# Patient Record
Sex: Female | Born: 1955 | Race: Black or African American | Hispanic: No | Smoking: Former smoker
Health system: Southern US, Community
[De-identification: ages and names within clinical notes are randomized; demographics above are authoritative.]

## PROBLEM LIST (undated history)

## (undated) DIAGNOSIS — R51 Headache: Secondary | ICD-10-CM

## (undated) DIAGNOSIS — I739 Peripheral vascular disease, unspecified: Secondary | ICD-10-CM

## (undated) DIAGNOSIS — K921 Melena: Secondary | ICD-10-CM

## (undated) DIAGNOSIS — Z720 Tobacco use: Secondary | ICD-10-CM

## (undated) DIAGNOSIS — I10 Essential (primary) hypertension: Secondary | ICD-10-CM

## (undated) DIAGNOSIS — E785 Hyperlipidemia, unspecified: Secondary | ICD-10-CM

## (undated) HISTORY — PX: ABDOMINAL HYSTERECTOMY: SHX81

## (undated) HISTORY — DX: Tobacco use: Z72.0

## (undated) HISTORY — DX: Peripheral vascular disease, unspecified: I73.9

---

## 1998-07-14 ENCOUNTER — Emergency Department (HOSPITAL_COMMUNITY): Admission: EM | Admit: 1998-07-14 | Discharge: 1998-07-14 | Payer: Self-pay | Admitting: Emergency Medicine

## 2000-07-23 ENCOUNTER — Emergency Department (HOSPITAL_COMMUNITY): Admission: EM | Admit: 2000-07-23 | Discharge: 2000-07-23 | Payer: Self-pay | Admitting: Emergency Medicine

## 2000-07-23 ENCOUNTER — Encounter: Payer: Self-pay | Admitting: Emergency Medicine

## 2005-03-15 ENCOUNTER — Inpatient Hospital Stay (HOSPITAL_COMMUNITY): Admission: EM | Admit: 2005-03-15 | Discharge: 2005-03-18 | Payer: Self-pay | Admitting: Emergency Medicine

## 2006-07-03 ENCOUNTER — Emergency Department (HOSPITAL_COMMUNITY): Admission: EM | Admit: 2006-07-03 | Discharge: 2006-07-03 | Payer: Self-pay | Admitting: Family Medicine

## 2006-08-02 ENCOUNTER — Emergency Department (HOSPITAL_COMMUNITY): Admission: EM | Admit: 2006-08-02 | Discharge: 2006-08-02 | Payer: Self-pay | Admitting: Emergency Medicine

## 2009-01-30 ENCOUNTER — Emergency Department (HOSPITAL_COMMUNITY): Admission: EM | Admit: 2009-01-30 | Discharge: 2009-01-31 | Payer: Self-pay | Admitting: Emergency Medicine

## 2009-05-02 ENCOUNTER — Emergency Department (HOSPITAL_COMMUNITY): Admission: EM | Admit: 2009-05-02 | Discharge: 2009-05-02 | Payer: Self-pay | Admitting: Family Medicine

## 2009-07-28 ENCOUNTER — Inpatient Hospital Stay (HOSPITAL_COMMUNITY): Admission: RE | Admit: 2009-07-28 | Discharge: 2009-07-30 | Payer: Self-pay | Admitting: Obstetrics and Gynecology

## 2009-07-28 ENCOUNTER — Encounter (INDEPENDENT_AMBULATORY_CARE_PROVIDER_SITE_OTHER): Payer: Self-pay | Admitting: Obstetrics and Gynecology

## 2010-06-08 LAB — CBC
HCT: 30.6 % — ABNORMAL LOW (ref 36.0–46.0)
Hemoglobin: 13.8 g/dL (ref 12.0–15.0)
MCHC: 33.3 g/dL (ref 30.0–36.0)
MCV: 82.3 fL (ref 78.0–100.0)
Platelets: 191 10*3/uL (ref 150–400)
RDW: 15.7 % — ABNORMAL HIGH (ref 11.5–15.5)
RDW: 16 % — ABNORMAL HIGH (ref 11.5–15.5)

## 2010-06-08 LAB — COMPREHENSIVE METABOLIC PANEL
ALT: 20 U/L (ref 0–35)
Albumin: 4 g/dL (ref 3.5–5.2)
Alkaline Phosphatase: 64 U/L (ref 39–117)
Glucose, Bld: 96 mg/dL (ref 70–99)
Potassium: 3.5 mEq/L (ref 3.5–5.1)
Sodium: 139 mEq/L (ref 135–145)
Total Protein: 7.8 g/dL (ref 6.0–8.3)

## 2010-06-08 LAB — PREGNANCY, URINE: Preg Test, Ur: NEGATIVE

## 2010-06-23 LAB — CBC
MCHC: 33.6 g/dL (ref 30.0–36.0)
MCV: 84.1 fL (ref 78.0–100.0)
Platelets: 240 10*3/uL (ref 150–400)

## 2010-06-23 LAB — DIFFERENTIAL
Eosinophils Relative: 1 % (ref 0–5)
Lymphocytes Relative: 43 % (ref 12–46)
Lymphs Abs: 2.8 10*3/uL (ref 0.7–4.0)
Monocytes Absolute: 0.5 10*3/uL (ref 0.1–1.0)
Neutro Abs: 3.2 10*3/uL (ref 1.7–7.7)

## 2010-06-23 LAB — LIPASE, BLOOD: Lipase: 22 U/L (ref 11–59)

## 2010-06-23 LAB — COMPREHENSIVE METABOLIC PANEL
AST: 18 U/L (ref 0–37)
Albumin: 3.8 g/dL (ref 3.5–5.2)
BUN: 11 mg/dL (ref 6–23)
Calcium: 9.2 mg/dL (ref 8.4–10.5)
Creatinine, Ser: 0.97 mg/dL (ref 0.4–1.2)
GFR calc Af Amer: >60 mL/min (ref >60)
GFR calc non Af Amer: >60 mL/min (ref >60)

## 2010-06-23 LAB — URINALYSIS, ROUTINE W REFLEX MICROSCOPIC
Bilirubin Urine: NEGATIVE
Ketones, ur: NEGATIVE mg/dL
Nitrite: NEGATIVE
Protein, ur: NEGATIVE mg/dL
Urobilinogen, UA: 0.2 mg/dL (ref 0.0–1.0)
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
## 2012-10-04 ENCOUNTER — Encounter (HOSPITAL_COMMUNITY): Payer: Self-pay | Admitting: *Deleted

## 2012-10-04 ENCOUNTER — Other Ambulatory Visit: Payer: Self-pay

## 2012-10-04 DIAGNOSIS — I10 Essential (primary) hypertension: Secondary | ICD-10-CM | POA: Insufficient documentation

## 2012-10-04 DIAGNOSIS — M940 Chondrocostal junction syndrome [Tietze]: Secondary | ICD-10-CM | POA: Insufficient documentation

## 2012-10-04 DIAGNOSIS — F17200 Nicotine dependence, unspecified, uncomplicated: Secondary | ICD-10-CM | POA: Insufficient documentation

## 2012-10-04 LAB — CBC WITH DIFFERENTIAL/PLATELET
Basophils Relative: 0 % (ref 0–1)
HCT: 38.7 % (ref 36.0–46.0)
Hemoglobin: 13.4 g/dL (ref 12.0–15.0)
Lymphocytes Relative: 53 % — ABNORMAL HIGH (ref 12–46)
Lymphs Abs: 3.6 10*3/uL (ref 0.7–4.0)
MCHC: 34.6 g/dL (ref 30.0–36.0)
Monocytes Absolute: 0.5 10*3/uL (ref 0.1–1.0)
Monocytes Relative: 7 % (ref 3–12)
Neutro Abs: 2.7 10*3/uL (ref 1.7–7.7)
Neutrophils Relative %: 39 % — ABNORMAL LOW (ref 43–77)
RBC: 4.96 MIL/uL (ref 3.87–5.11)

## 2012-10-04 LAB — POCT I-STAT TROPONIN I: Troponin i, poc: 0.03 ng/mL (ref 0.00–0.08)

## 2012-10-04 NOTE — ED Notes (Signed)
Pt states that she is having CP on the left side of her chest. Pt states that the pain increases when she sits low and has to stand up and when she also when she steps up, pt states when she coughs or turns.

## 2012-10-05 ENCOUNTER — Encounter (HOSPITAL_COMMUNITY): Payer: Self-pay | Admitting: *Deleted

## 2012-10-05 ENCOUNTER — Emergency Department (HOSPITAL_COMMUNITY): Payer: Self-pay

## 2012-10-05 ENCOUNTER — Emergency Department (HOSPITAL_COMMUNITY)
Admission: EM | Admit: 2012-10-05 | Discharge: 2012-10-05 | Disposition: A | Discharge status: Home / Self Care | Payer: Self-pay | Attending: Emergency Medicine | Admitting: Emergency Medicine

## 2012-10-05 DIAGNOSIS — M940 Chondrocostal junction syndrome [Tietze]: Secondary | ICD-10-CM

## 2012-10-05 HISTORY — DX: Essential (primary) hypertension: I10

## 2012-10-05 LAB — COMPREHENSIVE METABOLIC PANEL
Alkaline Phosphatase: 76 U/L (ref 39–117)
BUN: 9 mg/dL (ref 6–23)
CO2: 24 mEq/L (ref 19–32)
Chloride: 107 mEq/L (ref 96–112)
Creatinine, Ser: 1.08 mg/dL (ref 0.50–1.10)
GFR calc Af Amer: 65 mL/min — ABNORMAL LOW (ref >90)
GFR calc non Af Amer: 56 mL/min — ABNORMAL LOW (ref >90)
Glucose, Bld: 97 mg/dL (ref 70–99)
Potassium: 4 mEq/L (ref 3.5–5.1)
Total Bilirubin: 0.2 mg/dL — ABNORMAL LOW (ref 0.3–1.2)

## 2012-10-05 MED ORDER — IBUPROFEN 800 MG PO TABS
800.0000 mg | ORAL_TABLET | Freq: Once | ORAL | Status: AC
  Administered 2012-10-05: 800 mg via ORAL
  Filled 2012-10-05: qty 1

## 2012-10-05 MED ORDER — OXYCODONE-ACETAMINOPHEN 5-325 MG PO TABS: 1.0000 | ORAL_TABLET | ORAL | Status: DC | PRN

## 2012-10-05 NOTE — ED Provider Notes (Signed)
History    CSN: [id number] Arrival date & time 10/04/12  2323  First MD Initiated Contact with Patient 10/05/12 [phone]     Chief Complaint  Patient presents with  . Chest Pain   (Consider location/radiation/quality/duration/timing/severity/associated sxs/prior Treatment) The history is provided by the patient.   58 year old female has been having left anterior chest pain for the last week. Pain is sharp and is only present if she takes a deep breath or if she moves or twists or lays on her left side. She rates pain at 6/10 when present. There is no associated dyspnea, nausea, vomiting, diaphoresis. She's not had any fever or chills or sweats other than occasional hot flashes which she has been having for quite some time. She denies cough. She denies any trauma. She does smoke about one third pack of cigarettes a day. She has not taken any medication to try and help this. Past Medical History  Diagnosis Date  . Hypertension    Past Surgical History  Procedure Laterality Date  . Abdominal hysterectomy     History reviewed. No pertinent family history. History  Substance Use Topics  . Smoking status: Current Every Day Smoker  . Smokeless tobacco: Not on file  . Alcohol Use: Yes   OB History   Grav Para Term Preterm Abortions TAB SAB Ect Mult Living                 Review of Systems  All other systems reviewed and are negative.    Allergies  Review of patient's allergies indicates no known allergies.  Home Medications  No current outpatient prescriptions on file. BP 163/99  Pulse 64  Temp(Src) 98 F (36.7 C) (Oral)  SpO2 98% Physical Exam  Nursing note and vitals reviewed.  57 year old female, resting comfortably and in no acute distress. Vital signs are significant for hypertension with blood pressure 163/99. Oxygen saturation is 98%, which is normal. Head is normocephalic and atraumatic. PERRLA, EOMI. Oropharynx is clear. Neck is nontender and supple without  adenopathy or JVD. Back is nontender and there is no CVA tenderness. Lungs are clear without rales, wheezes, or rhonchi. Chest is moderately tender in the left anterior chest wall which reproduces her pain. Heart has regular rate and rhythm without murmur. Abdomen is soft, flat, nontender without masses or hepatosplenomegaly and peristalsis is normoactive. Extremities have no cyanosis or edema, full range of motion is present. Skin is warm and dry without rash. Neurologic: Mental status is normal, cranial nerves are intact, there are no motor or sensory deficits.  ED Course  Procedures (including critical care time) Results for orders placed during the hospital encounter of 10/05/12  CBC WITH DIFFERENTIAL      Result Value Range   WBC 6.9  4.0 - 10.5 K/uL   RBC 4.96  3.87 - 5.11 MIL/uL   Hemoglobin 13.4  12.0 - 15.0 g/dL   HCT 11.9  14.7 - 82.9 %   MCV 78.0  78.0 - 100.0 fL   MCH 27.0  26.0 - 34.0 pg   MCHC 34.6  30.0 - 36.0 g/dL   RDW 56.2  13.0 - 86.5 %   Platelets 261  150 - 400 K/uL   Neutrophils Relative % 39 (*) 43 - 77 %   Neutro Abs 2.7  1.7 - 7.7 K/uL   Lymphocytes Relative 53 (*) 12 - 46 %   Lymphs Abs 3.6  0.7 - 4.0 K/uL   Monocytes Relative 7  3 -  12 %   Monocytes Absolute 0.5  0.1 - 1.0 K/uL   Eosinophils Relative 2  0 - 5 %   Eosinophils Absolute 0.1  0.0 - 0.7 K/uL   Basophils Relative 0  0 - 1 %   Basophils Absolute 0.0  0.0 - 0.1 K/uL  COMPREHENSIVE METABOLIC PANEL      Result Value Range   Sodium 140  135 - 145 mEq/L   Potassium 4.0  3.5 - 5.1 mEq/L   Chloride 107  96 - 112 mEq/L   CO2 24  19 - 32 mEq/L   Glucose, Bld 97  70 - 99 mg/dL   BUN 9  6 - 23 mg/dL   Creatinine, Ser 1.61  0.50 - 1.10 mg/dL   Calcium 9.7  8.4 - 09.6 mg/dL   Total Protein 7.4  6.0 - 8.3 g/dL   Albumin 3.5  3.5 - 5.2 g/dL   AST 19  0 - 37 U/L   ALT 15  0 - 35 U/L   Alkaline Phosphatase 76  39 - 117 U/L   Total Bilirubin 0.2 (*) 0.3 - 1.2 mg/dL   GFR calc non Af Amer 56 (*) >90  mL/min   GFR calc Af Amer 65 (*) >90 mL/min  POCT I-STAT TROPONIN I      Result Value Range   Troponin i, poc 0.03  0.00 - 0.08 ng/mL   Comment 3            Dg Chest 2 View  10/05/2012   *RADIOLOGY REPORT*  Clinical Data: Left-sided chest pain on inspiration for 1 week.  CHEST - 2 VIEW  Comparison: Chest radiograph performed 03/16/2005  Findings: The lungs are well-aerated and clear.  There is no evidence of focal opacification, pleural effusion or pneumothorax.  The heart is normal in size; the mediastinal contour is within normal limits.  No acute osseous abnormalities are seen.  IMPRESSION: No acute cardiopulmonary process seen.   Original Report Authenticated By: Tonia Ghent, M.D.    Images viewed by me.  ECG shows normal sinus rhythm with sinus arrhythmia with a rate of 70, no ectopy. Normal axis. Normal P wave. Normal QRS. Normal intervals. Normal ST and T waves. Impression: normal ECG. No prior ECG available for comparison.   1. Costochondritis     MDM  Pleuritic chest pain most consistent with costochondritis. Right shift is noted on CBC which would be consistent with viral costochondritis. Chest x-ray will be obtained and that I anticipate sending her home with a prescription for naproxen. Blood pressure is noted to be elevated and she'll need to have that rechecked as an outpatient. She's advised to stop smoking.  Dione Booze, MD 10/05/12 [phone]

## 2013-04-10 ENCOUNTER — Ambulatory Visit (INDEPENDENT_AMBULATORY_CARE_PROVIDER_SITE_OTHER): Payer: Managed Care, Other (non HMO)

## 2013-04-10 VITALS — BP 149/90 | HR 77 | Resp 16 | Ht 66.0 in | Wt 155.0 lb

## 2013-04-10 DIAGNOSIS — M79609 Pain in unspecified limb: Secondary | ICD-10-CM

## 2013-04-10 DIAGNOSIS — G609 Hereditary and idiopathic neuropathy, unspecified: Secondary | ICD-10-CM

## 2013-04-10 DIAGNOSIS — I739 Peripheral vascular disease, unspecified: Secondary | ICD-10-CM

## 2013-04-10 DIAGNOSIS — G629 Polyneuropathy, unspecified: Secondary | ICD-10-CM

## 2013-04-10 DIAGNOSIS — Q828 Other specified congenital malformations of skin: Secondary | ICD-10-CM

## 2013-04-10 NOTE — Patient Instructions (Signed)
Peripheral Vascular Disease  Peripheral vascular disease (PVD) is caused by cholesterol buildup in the arteries. The arteries become narrow or clogged. This makes it hard for blood to flow. It happens most in the legs, but it can occur in other areas of your body. HOME CARE   Quit smoking, if you smoke.  Exercise as told by your doctor.  Follow a low-fat, low-cholesterol diet as told by your doctor.  Control your diabetes, if you have diabetes.  Care for your feet to prevent infection.  Only take medicine as told by your doctor. GET HELP RIGHT AWAY IF:   You have pain or lose feeling (numbness) in your arms or legs.  Your arms or legs turn cold or blue.  You have redness, warmth, and puffiness (swelling) in your arms or legs. MAKE SURE YOU:   Understand these instructions.  Will watch your condition.  Will get help right away if you are not doing well or get worse. Document Released: 06/01/2009 Document Revised: 05/30/2011 Document Reviewed: 06/01/2009 [facility] Patient Information 2014 [place], [place].   Keep the [facility] that [facility] for lower extremity arterial Doppler studies to confirm your circulation status. It is any extreme changes in severity her pain contact us immediately or go to the emergency room immediately. Especially there is any changes in coloration of the foot are spherical this in your foot or extremities.

## 2013-04-10 NOTE — Progress Notes (Signed)
   Subjective:    Patient ID: Glenetta HewBrenda S Lafrance, female    DOB: 01/02/1956, 58 y.o.   MRN: [id number]  HPI N numbness        L right forefoot        D 1 month        O at pm        C numbness, cold sensation, and sensation of being bunched together        A nighttime mostly and in bed        T dangle legs over bed, walking the bedroom Patient has numbness stinging and burning right foot more so than left has loss of sensation is weakness and balance the she does any prolonged walking or standing. She barely walk the length of her driveway without and take a break in sitdown with cramping of her upper right leg. Her feet greater right foot leg get cold sometimes bluish purple in the past although not reproducible at this visit.   Review of Systems  Constitutional: Positive for fatigue.       Up all night due to right foot  HENT: Negative.   Eyes: Negative.  Negative for discharge.  Respiratory: Negative.   Cardiovascular: Negative.   Gastrointestinal:       Right side abdominal pain,  Tightness at hip.  Blood in the stool occasionally.  Endocrine: Positive for cold intolerance and heat intolerance.  Genitourinary: Negative.   Musculoskeletal: Negative.   Skin: Negative.   Allergic/Immunologic: Negative.   Neurological: Negative.   Hematological: Negative.        Objective:   Physical Exam Vascular status as follows pedal pulses DP thready at plus one over 4 bilateral PT nonpalpable bilateral temperature warm to cool turgor diminished there is no edema number noted currently although may be history of rubra noted no pallor noted no varicosities noted. Skin color pigment normal hair growth absent. There is keratotic lesions sub-third and left heel in sub-14 on the right consistent with a verrucoid a poor keratotic lesions. Patient does have tingling burning and stinging right more so than left no history of injury trauma no history of back problems. Patient is now seen a primary  physician several years with previous he treated for hypertension and does have elevated blood pressure at today's visit Powers not to treat again for years. Refill timed 3-4 or 4-5 seconds all digits. Again symptoms of claudication with prolonged standing or walking. Orthopedic biomechanical exam relatively unremarkable some mild digital contractures semirigid in nature mild bunion deformity however otherwise good range of motion the ankle mid tarsus subtalar joint slight promontory changes noted clinically. No x-rays taken at this time. Random blood glucose taken at today's visit indicated 91 mg/dL reading which is a normal level however patient continues to have significant findings likely for vascular compromise and posse secondary neurologic symptoms we'll make arrangements for lower extremity arterial Doppler studies [facility] in the future also recommend a primary physician for further workup and evaluation.       Assessment & Plan:  Assessment this time cannot rule out claudication possible PAD symptomology. Patient may also have neuropathy possibly the diabetic type neuropathy. Has not seen a primary physician in prolonged years. Patient does have some atrophy the skin with multiple 4 keratoses will need palliative care in the future as needed we'll make arrangements for lower extremity arterial Doppler studies at this time and further vascular evaluation as needed thereafter.  Alvan Dameichard Kiara Keep DPM

## 2013-04-11 ENCOUNTER — Telehealth: Payer: Self-pay | Admitting: *Deleted

## 2013-04-11 ENCOUNTER — Other Ambulatory Visit: Payer: Self-pay | Admitting: *Deleted

## 2013-04-11 ENCOUNTER — Ambulatory Visit (HOSPITAL_COMMUNITY)
Admission: RE | Admit: 2013-04-11 | Discharge: 2013-04-11 | Disposition: A | Discharge status: Home / Self Care | Payer: Managed Care, Other (non HMO) | Source: Ambulatory Visit

## 2013-04-11 DIAGNOSIS — R52 Pain, unspecified: Secondary | ICD-10-CM

## 2013-04-11 DIAGNOSIS — I70209 Unspecified atherosclerosis of native arteries of extremities, unspecified extremity: Secondary | ICD-10-CM | POA: Insufficient documentation

## 2013-04-11 NOTE — Telephone Encounter (Signed)
Message copied by Marissa Nestle'CONNELL, VALERY D on Thu Apr 11, 2013 10:11 AM ------      Message from: KnightdaleSMITH, Shanda BumpsJESSICA M      Created: Wed Apr 10, 2013  3:49 PM      Regarding: Vascular Study      Contact: [phone]       Steward DroneBrenda called stating she called to get an appointment for the vascular study requested within the next week and the first available appointment is on February 4th. Steward DroneBrenda was wondering if this appointment date is okay. Requested for us to call her back.            Thanks.            Shanda BumpsJessica ------

## 2013-04-11 NOTE — Progress Notes (Signed)
Lower Extremity Arterial Duplex Completed. 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 Brianna L Mazza,RVT

## 2013-04-11 NOTE — Telephone Encounter (Signed)
Message copied by Marissa Nestle'CONNELL, Damel Querry D on Thu Apr 11, 2013  9:35 AM ------      Message from: [place]. JohnSMITH, Shanda BumpsJESSICA M      Created: Wed Apr 10, 2013  3:49 PM      Regarding: Vascular Study      Contact: [phone]       Steward DroneBrenda called stating she called to get an appointment for the vascular study requested within the next week and the first available appointment is on February 4th. Steward DroneBrenda was wondering if this appointment date is okay. Requested for us to call her back.            Thanks.            Shanda BumpsJessica ------

## 2013-04-11 NOTE — Telephone Encounter (Signed)
Pt states she called [facility], the earliest appt available was 04/24/2013.  I informed the pt that I made her referral to [facility] as STAT meaning as soon as possible, and she could call this am to schedule.

## 2013-04-23 ENCOUNTER — Other Ambulatory Visit: Payer: Self-pay | Admitting: Cardiovascular Disease

## 2013-04-23 ENCOUNTER — Ambulatory Visit (INDEPENDENT_AMBULATORY_CARE_PROVIDER_SITE_OTHER): Payer: Managed Care, Other (non HMO) | Admitting: Cardiovascular Disease

## 2013-04-23 ENCOUNTER — Encounter: Payer: Self-pay | Admitting: Cardiovascular Disease

## 2013-04-23 VITALS — BP 152/96 | HR 72 | Ht 66.0 in | Wt 165.0 lb

## 2013-04-23 DIAGNOSIS — R5381 Other malaise: Secondary | ICD-10-CM

## 2013-04-23 DIAGNOSIS — Z79899 Other long term (current) drug therapy: Secondary | ICD-10-CM

## 2013-04-23 DIAGNOSIS — E785 Hyperlipidemia, unspecified: Secondary | ICD-10-CM

## 2013-04-23 DIAGNOSIS — I739 Peripheral vascular disease, unspecified: Secondary | ICD-10-CM | POA: Insufficient documentation

## 2013-04-23 DIAGNOSIS — R5383 Other fatigue: Secondary | ICD-10-CM

## 2013-04-23 DIAGNOSIS — I10 Essential (primary) hypertension: Secondary | ICD-10-CM | POA: Insufficient documentation

## 2013-04-23 NOTE — Assessment & Plan Note (Signed)
Patient has had 2 years of claudication worse in the last several months. She was referred by [facility]ichard Sikora [facility] or for further evaluation. 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

## 2013-04-23 NOTE — Patient Instructions (Signed)
  We will see you back in follow up in after the tests.   Dr Allyson SabalBerry has ordered a lexiscan myoview, blood work, and a CT angio of pelvis and lower extremity with 1mm cuts from distal aorta down to the common femoral bilaterally.

## 2013-04-23 NOTE — Progress Notes (Signed)
04/23/2013 Kelly [name]   03-11-1956  [id number]  Primary Physician No PCP Per Patient Primary Cardiologist: Kelly Gess MD Kelly [name]   HPI:  Kelly [name] is a 58 year old moderately overweight separated African American female mother of 4 relative to grandchildren was accompanied by one of her daughters today. She is referred by Dr. Alvan [name] for peripheral vasodilation because of claudication. Her cardiovascular risk factor profile is positive for 20-pack-years of tobacco abuse smoking a pulse of one half pack per day. She has untreated hypertension. She does have a family history of heart disease with a father who died of a myocardial function at age 52 and several brothers who have had heart disease as well. She has never had a heart attack or stroke. She does complain of chest pain which occurs both with some of them sound anginal. She's had claudication for the last 2 years worse in the last 2 months. 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 She has resting leg pain and is unable to ambulate because of this.   No current outpatient prescriptions on file.   No current facility-administered medications for this visit.    No Known Allergies  History   Social History  . Marital Status: Legally Separated    Spouse Name: N/A    Number of Children: N/A  . Years of Education: N/A   Occupational History  . Not on file.   Social History Main Topics  . Smoking status: Current Every Day Smoker  . Smokeless tobacco: Not on file  . Alcohol Use: Yes  . Drug Use: No  . Sexual Activity: Not on file   Other Topics Concern  . Not on file   Social History Narrative  . No narrative on file     Review of Systems: General: negative for chills, fever, night sweats or weight changes.    Cardiovascular: negative for chest pain, dyspnea on exertion, edema, orthopnea, palpitations, paroxysmal nocturnal dyspnea or shortness of breath Dermatological: negative for rash Respiratory: negative for cough or wheezing Urologic: negative for hematuria Abdominal: negative for nausea, vomiting, diarrhea, bright red blood per rectum, melena, or hematemesis Neurologic: negative for visual changes, syncope, or dizziness All other systems reviewed and are otherwise negative except as noted above.    Blood pressure 152/96, pulse 72, height 5\' 6"  (1.676 m), weight 165 lb (74.844 kg).  General appearance: alert and no distress Neck: no adenopathy, no carotid bruit, no JVD, supple, symmetrical, trachea midline and thyroid not enlarged, symmetric, no tenderness/mass/nodules Lungs: clear to auscultation bilaterally Heart: regular rate and rhythm, S1, S2 normal, no murmur, click, rub or gallop Extremities: extremities normal, atraumatic, no cyanosis or edema and absent femoral and pedal pulses without bruits  EKG performed today  ASSESSMENT AND PLAN:   Claudication Patient has had 2 years of claudication worse in the last several months. She was referred by Dr. Alvan [name] at [facility] or for further evaluation. 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  Essential hypertension Blood pressure is mildly  elevated today. We'll check a basic metabolic panel to assess his renal function prior to beginning him on antihypertensive medications.      Kelly GessJonathan J. Maryon Kemnitz MD FACP,FACC,FAHA, [facility] 04/23/2013 1:33 PM

## 2013-04-23 NOTE — Assessment & Plan Note (Signed)
Blood pressure is mildly elevated today. We'll check a basic metabolic panel to assess his renal function prior to beginning him on antihypertensive medications.

## 2013-04-30 ENCOUNTER — Ambulatory Visit (HOSPITAL_COMMUNITY)
Admission: RE | Admit: 2013-04-30 | Discharge: 2013-04-30 | Disposition: A | Discharge status: Home / Self Care | Payer: Managed Care, Other (non HMO) | Source: Ambulatory Visit | Attending: Cardiovascular Disease | Admitting: Cardiovascular Disease

## 2013-04-30 DIAGNOSIS — Z8249 Family history of ischemic heart disease and other diseases of the circulatory system: Secondary | ICD-10-CM | POA: Insufficient documentation

## 2013-04-30 DIAGNOSIS — F17200 Nicotine dependence, unspecified, uncomplicated: Secondary | ICD-10-CM | POA: Insufficient documentation

## 2013-04-30 DIAGNOSIS — R0609 Other forms of dyspnea: Secondary | ICD-10-CM | POA: Insufficient documentation

## 2013-04-30 DIAGNOSIS — I739 Peripheral vascular disease, unspecified: Secondary | ICD-10-CM | POA: Insufficient documentation

## 2013-04-30 DIAGNOSIS — R5381 Other malaise: Secondary | ICD-10-CM | POA: Insufficient documentation

## 2013-04-30 DIAGNOSIS — R5383 Other fatigue: Secondary | ICD-10-CM

## 2013-04-30 DIAGNOSIS — R0989 Other specified symptoms and signs involving the circulatory and respiratory systems: Secondary | ICD-10-CM | POA: Insufficient documentation

## 2013-04-30 DIAGNOSIS — I10 Essential (primary) hypertension: Secondary | ICD-10-CM | POA: Insufficient documentation

## 2013-04-30 DIAGNOSIS — R079 Chest pain, unspecified: Secondary | ICD-10-CM | POA: Insufficient documentation

## 2013-04-30 MED ORDER — TECHNETIUM TC 99M SESTAMIBI GENERIC - CARDIOLITE
30.1000 | Freq: Once | INTRAVENOUS | Status: AC | PRN
  Administered 2013-04-30: 30.1 via INTRAVENOUS

## 2013-04-30 MED ORDER — AMINOPHYLLINE 25 MG/ML IV SOLN
75.0000 mg | Freq: Once | INTRAVENOUS | Status: AC
  Administered 2013-04-30: 75 mg via INTRAVENOUS

## 2013-04-30 MED ORDER — TECHNETIUM TC 99M SESTAMIBI GENERIC - CARDIOLITE
10.2000 | Freq: Once | INTRAVENOUS | Status: AC | PRN
  Administered 2013-04-30: 10 via INTRAVENOUS

## 2013-04-30 MED ORDER — REGADENOSON 0.4 MG/5ML IV SOLN
0.4000 mg | Freq: Once | INTRAVENOUS | Status: AC
  Administered 2013-04-30: 0.4 mg via INTRAVENOUS

## 2013-04-30 NOTE — Procedures (Addendum)
[facility] [address] [place] [address] [phone]  Cardiology Nuclear Med Study  Kelly [name] is a 58 y.o. female     MRN : [id number]     DOB: 16-Dec-1955  Procedure Date: 04/30/2013  Nuclear Med Background Indication for Stress Test:  Evaluation for Ischemia History:  No prior cardiac or respiratory history reported by patient. Cardiac Risk Factors: Family History - CAD, Hypertension, PVD and Smoker  Symptoms:  Chest Pain, DOE and Fatigue   Nuclear Pre-Procedure Caffeine/Decaff Intake:  12:00am NPO After: 10am   IV Site: R Forearm  IV 0.9% NS with Angio Cath:  22g  Chest Size (in):  n/a IV Started by: Emmit PomfretAmanda Hicks, RN  Height: 5\' 6"  (1.676 m)  Cup Size: C  BMI:  Body mass index is 26.64 kg/(m^2). Weight:  165 lb (74.844 kg)   Tech Comments:  n/a    Nuclear Med Study 1 or 2 day study: 1 day  Stress Test Type:  Lexiscan  Order Authorizing Provider:  Nanetta BattyJonathan Lamond Glantz, MD   Resting Radionuclide: Technetium 3413m Sestamibi  Resting Radionuclide Dose:10.2  mCi   Stress Radionuclide:  Technetium 6813m Sestamibi  Stress Radionuclide Dose: 30.1 mCi           Stress Protocol Rest HR: 62 Stress HR: 105  Rest BP: 154/97 Stress BP: 162/102  Exercise Time (min): n/a METS: n/a   Predicted Max HR: 163 bpm % Max HR: 64.42 bpm Rate Pressure Product: [id number]  Dose of Adenosine (mg):  n/a Dose of Lexiscan: 0.4 mg  Dose of Atropine (mg): n/a Dose of Dobutamine: n/a mcg/kg/min (at max HR)  Stress Test Technologist: Esperanza Sheetserry-Marie Martin, CCT Nuclear Technologist: Gonzella LexPam Phillips, CNMT   Rest Procedure:  Myocardial perfusion imaging was performed at rest 45 minutes following the intravenous administration of Technetium 6413m Sestamibi. Stress Procedure:  The patient received IV Lexiscan 0.4 mg over 15-seconds.  Technetium 4413m Sestamibi injected at 30-seconds.  There were no significant changes with Lexiscan.  Quantitative spect images  were obtained after a 45 minute delay. Pt complained of a headache post lexiscan infusion. Pt given 75 mg aminophylline iv with resolution of symptoms.  Transient Ischemic Dilatation (Normal <1.22):  0.85 Lung/Heart Ratio (Normal <0.45):  0.29 QGS EDV:  53 ml QGS ESV:  15 ml LV Ejection Fraction: 72%  Signed by      Rest ECG: NSR - Normal EKG  Stress ECG: No significant change from baseline ECG  QPS Raw Data Images:  Normal; no motion artifact; normal heart/lung ratio. Stress Images:  Normal homogeneous uptake in all areas of the myocardium. Rest Images:  Normal homogeneous uptake in all areas of the myocardium. Subtraction (SDS):  No evidence of ischemia.  Impression Exercise Capacity:  Lexiscan with no exercise. BP Response:  Normal blood pressure response. Clinical Symptoms:  No significant symptoms noted. ECG Impression:  No significant ST segment change suggestive of ischemia. Comparison with Prior Nuclear Study: No images to compare  Overall Impression:  Normal stress nuclear study.  LV Wall Motion:  NL LV Function; NL Wall Motion   [name],[name], MD  05/20/2013 5:53 PM

## 2013-05-03 ENCOUNTER — Ambulatory Visit
Admission: RE | Admit: 2013-05-03 | Discharge: 2013-05-03 | Disposition: A | Discharge status: Home / Self Care | Payer: Managed Care, Other (non HMO) | Source: Ambulatory Visit | Attending: Cardiovascular Disease | Admitting: Cardiovascular Disease

## 2013-05-03 DIAGNOSIS — R5383 Other fatigue: Secondary | ICD-10-CM

## 2013-05-03 DIAGNOSIS — I739 Peripheral vascular disease, unspecified: Secondary | ICD-10-CM

## 2013-05-03 MED ORDER — IOHEXOL 350 MG/ML SOLN
150.0000 mL | Freq: Once | INTRAVENOUS | Status: AC | PRN
  Administered 2013-05-03: 150 mL via INTRAVENOUS

## 2013-05-20 NOTE — Addendum Note (Signed)
Encounter addended by: Runell GessJonathan J Akayla Brass, MD on: 05/20/2013  6:28 PM<BR>     Documentation filed: Clinical Notes

## 2013-05-21 ENCOUNTER — Encounter: Payer: Self-pay | Admitting: *Deleted

## 2013-05-24 ENCOUNTER — Ambulatory Visit
Admission: RE | Admit: 2013-05-24 | Discharge: 2013-05-24 | Disposition: A | Discharge status: Home / Self Care | Payer: Managed Care, Other (non HMO) | Source: Ambulatory Visit | Attending: Cardiovascular Disease | Admitting: Cardiovascular Disease

## 2013-05-24 ENCOUNTER — Encounter: Payer: Self-pay | Admitting: Cardiovascular Disease

## 2013-05-24 ENCOUNTER — Encounter (HOSPITAL_COMMUNITY): Payer: Self-pay | Admitting: Pharmacy Technician

## 2013-05-24 ENCOUNTER — Ambulatory Visit (INDEPENDENT_AMBULATORY_CARE_PROVIDER_SITE_OTHER): Payer: Managed Care, Other (non HMO) | Admitting: Cardiovascular Disease

## 2013-05-24 VITALS — BP 160/100 | HR 76 | Ht 66.0 in | Wt 166.0 lb

## 2013-05-24 DIAGNOSIS — F17200 Nicotine dependence, unspecified, uncomplicated: Secondary | ICD-10-CM

## 2013-05-24 DIAGNOSIS — I10 Essential (primary) hypertension: Secondary | ICD-10-CM

## 2013-05-24 DIAGNOSIS — Z720 Tobacco use: Secondary | ICD-10-CM

## 2013-05-24 DIAGNOSIS — Z01818 Encounter for other preprocedural examination: Secondary | ICD-10-CM

## 2013-05-24 DIAGNOSIS — I739 Peripheral vascular disease, unspecified: Secondary | ICD-10-CM

## 2013-05-24 DIAGNOSIS — D689 Coagulation defect, unspecified: Secondary | ICD-10-CM

## 2013-05-24 LAB — CBC
HEMATOCRIT: 39.2 % (ref 36.0–46.0)
Hemoglobin: 13 g/dL (ref 12.0–15.0)
MCH: 26.6 pg (ref 26.0–34.0)
MCHC: 33.2 g/dL (ref 30.0–36.0)
MCV: 80.2 fL (ref 78.0–100.0)
Platelets: 323 10*3/uL (ref 150–400)
RBC: 4.89 MIL/uL (ref 3.87–5.11)
RDW: 16.8 % — AB (ref 11.5–15.5)
WBC: 6 10*3/uL (ref 4.0–10.5)

## 2013-05-24 NOTE — Assessment & Plan Note (Signed)
Her blood pressure is elevated today. I would check routine lab work on her and begin her on antihypertensive medication.

## 2013-05-24 NOTE — Assessment & Plan Note (Signed)
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 Prior to recommending best option for revascularization I think that abdominal aortography would be the next option. I'm going to arrange to perform his radially.

## 2013-05-24 NOTE — Progress Notes (Signed)
   05/24/2013 Kelly [name]   02/10/1956  [id number]  Primary Physician No PCP Per Patient Primary Cardiologist: Kelly [name] J. Kelly Milling MD FACP,FACC,FAHA, FSCAI    HPI:  Kelly [name] is a 57-year-old moderately overweight separated African American female mother of 4 relative to grandchildren was accompanied by one of her daughters today. She is referred by Dr. Richard [name] for peripheral vasodilation because of claudication. Her cardiovascular risk factor profile is positive for 20-pack-years of tobacco abuse smoking a pulse of one half pack per day. She has untreated hypertension. She does have a family history of heart disease with a father who died of a myocardial function at age 63 and several brothers who have had heart disease as well. She has never had a heart attack or stroke. She does complain of chest pain which occurs both with some of them sound anginal. She's had claudication for the last 2 years worse in the last 2 months. 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 She has resting leg pain and is unable to ambulate because of this. 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 A Myoview stress test was nonischemic. Based on this I plan on performing abdominal aortography via the right radial approach to further characterize her anatomy and determined that the best method for revascularization.   No current outpatient prescriptions on file.   No current facility-administered medications for this visit.    No Known Allergies  History   Social History  . Marital Status: Legally Separated    Spouse Name: N/A    Number of Children: N/A  . Years of Education: N/A   Occupational History  . Not on file.   Social History Main Topics  . Smoking status:  Current Every Day Smoker  . Smokeless tobacco: Not on file  . Alcohol Use: Yes  . Drug Use: No  . Sexual Activity: Not on file   Other Topics Concern  . Not on file   Social History Narrative  . No narrative on file     Review of Systems: General: negative for chills, fever, night sweats or weight changes.  Cardiovascular: negative for chest pain, dyspnea on exertion, edema, orthopnea, palpitations, paroxysmal nocturnal dyspnea or shortness of breath Dermatological: negative for rash Respiratory: negative for cough or wheezing Urologic: negative for hematuria Abdominal: negative for nausea, vomiting, diarrhea, bright red blood per rectum, melena, or hematemesis Neurologic: negative for visual changes, syncope, or dizziness All other systems reviewed and are otherwise negative except as noted above.    Blood pressure 160/100, pulse 76, height 5' 6" (1.676 m), weight 75.297 kg (166 lb).  General appearance: alert and no distress Neck: no adenopathy, no carotid bruit, no JVD, supple, symmetrical, trachea midline and thyroid not enlarged, symmetric, no tenderness/mass/nodules Lungs: clear to auscultation bilaterally Heart: regular rate and rhythm, S1, S2 normal, no murmur, click, rub or gallop Extremities: extremities normal, atraumatic, no cyanosis or edema and absent pedal pulses  EKG not performed today  ASSESSMENT AND PLAN:   Claudication The CT angiogram showed occlusion of the distal abdominal aorta just above the iliac bifurcation with reconstitution in the external iliac arteries. Prior to recommending best option for revascularization I think that abdominal aortography would be the next option. I'm going to arrange to perform his radially.    Essential hypertension Her blood pressure is elevated today. I would check routine lab work on her and begin her on antihypertensive medication.      Kelly [name] J. Jayin Derousse MD FACP,FACC,FAHA, FSCAI 05/24/2013 12:30 PM  

## 2013-05-24 NOTE — Patient Instructions (Signed)
Dr. Allyson SabalBerry has ordered a abdominal aortagram to be done RAD[facility].  This procedure is going to look at the bloodflow in your lower extremities.  If Dr. Allyson SabalBerry is able to open up the arteries, you will have to spend one night in the hospital.  If he is not able to open the arteries, you will be able to go home that same day.    After the procedure, you will not be allowed to drive for 3 days or push, pull, or lift anything greater than 10 lbs for one week.    You will be required to have bloodwork and a chest xray prior to your procedure.  Our scheduler will advise you on when these items need to be done.

## 2013-05-25 LAB — PROTIME-INR
INR: 1 (ref <1.50)
PROTHROMBIN TIME: 13.1 s (ref 11.6–15.2)

## 2013-05-25 LAB — BASIC METABOLIC PANEL
BUN: 9 mg/dL (ref 6–23)
CALCIUM: 10.4 mg/dL (ref 8.4–10.5)
CHLORIDE: 105 meq/L (ref 96–112)
CO2: 26 meq/L (ref 19–32)
Creat: 0.75 mg/dL (ref 0.50–1.10)
GLUCOSE: 89 mg/dL (ref 70–99)
Potassium: 4.2 mEq/L (ref 3.5–5.3)
SODIUM: 141 meq/L (ref 135–145)

## 2013-05-25 LAB — TSH: TSH: 0.492 u[IU]/mL (ref 0.350–4.500)

## 2013-05-25 LAB — APTT: aPTT: 27 seconds (ref 24–37)

## 2013-05-28 ENCOUNTER — Encounter: Payer: Self-pay | Admitting: *Deleted

## 2013-05-30 ENCOUNTER — Encounter (HOSPITAL_COMMUNITY)
Admission: RE | Disposition: A | Discharge status: Home / Self Care | Payer: Self-pay | Source: Ambulatory Visit | Attending: Cardiovascular Disease

## 2013-05-30 ENCOUNTER — Ambulatory Visit (HOSPITAL_COMMUNITY)
Admission: RE | Admit: 2013-05-30 | Discharge: 2013-05-30 | Disposition: A | Discharge status: Home / Self Care | Payer: Managed Care, Other (non HMO) | Source: Ambulatory Visit | Attending: Cardiovascular Disease | Admitting: Cardiovascular Disease

## 2013-05-30 DIAGNOSIS — I7409 Other arterial embolism and thrombosis of abdominal aorta: Secondary | ICD-10-CM | POA: Insufficient documentation

## 2013-05-30 DIAGNOSIS — I10 Essential (primary) hypertension: Secondary | ICD-10-CM | POA: Insufficient documentation

## 2013-05-30 DIAGNOSIS — Z01818 Encounter for other preprocedural examination: Secondary | ICD-10-CM

## 2013-05-30 DIAGNOSIS — E663 Overweight: Secondary | ICD-10-CM | POA: Insufficient documentation

## 2013-05-30 DIAGNOSIS — I739 Peripheral vascular disease, unspecified: Secondary | ICD-10-CM | POA: Insufficient documentation

## 2013-05-30 DIAGNOSIS — F17200 Nicotine dependence, unspecified, uncomplicated: Secondary | ICD-10-CM | POA: Insufficient documentation

## 2013-05-30 DIAGNOSIS — I745 Embolism and thrombosis of iliac artery: Secondary | ICD-10-CM | POA: Insufficient documentation

## 2013-05-30 DIAGNOSIS — Z8249 Family history of ischemic heart disease and other diseases of the circulatory system: Secondary | ICD-10-CM | POA: Insufficient documentation

## 2013-05-30 DIAGNOSIS — I700 Atherosclerosis of aorta: Secondary | ICD-10-CM

## 2013-05-30 HISTORY — PX: ABDOMINAL AORTAGRAM: SHX5454

## 2013-05-30 SURGERY — ABDOMINAL AORTAGRAM
Anesthesia: LOCAL

## 2013-05-30 MED ORDER — MORPHINE SULFATE 2 MG/ML IJ SOLN: 1.0000 mg | INTRAMUSCULAR | Status: DC | PRN

## 2013-05-30 MED ORDER — LIDOCAINE HCL (PF) 1 % IJ SOLN
INTRAMUSCULAR | Status: AC
  Filled 2013-05-30: qty 30

## 2013-05-30 MED ORDER — HEPARIN (PORCINE) IN NACL 2-0.9 UNIT/ML-% IJ SOLN
INTRAMUSCULAR | Status: AC
  Filled 2013-05-30: qty 1000

## 2013-05-30 MED ORDER — MIDAZOLAM HCL 2 MG/2ML IJ SOLN
INTRAMUSCULAR | Status: AC
  Filled 2013-05-30: qty 2

## 2013-05-30 MED ORDER — ASPIRIN 81 MG PO CHEW
81.0000 mg | CHEWABLE_TABLET | ORAL | Status: AC
  Administered 2013-05-30: 81 mg via ORAL

## 2013-05-30 MED ORDER — SODIUM CHLORIDE 0.9 % IJ SOLN: 3.0000 mL | INTRAMUSCULAR | Status: DC | PRN

## 2013-05-30 MED ORDER — ASPIRIN 81 MG PO CHEW
CHEWABLE_TABLET | ORAL | Status: AC
  Filled 2013-05-30: qty 1

## 2013-05-30 MED ORDER — HYDROCODONE-ACETAMINOPHEN 5-325 MG PO TABS
1.0000 | ORAL_TABLET | ORAL | Status: DC | PRN
  Administered 2013-05-30: 2 via ORAL
  Filled 2013-05-30: qty 2

## 2013-05-30 MED ORDER — VERAPAMIL HCL 2.5 MG/ML IV SOLN
INTRAVENOUS | Status: AC
  Filled 2013-05-30: qty 2

## 2013-05-30 MED ORDER — SODIUM CHLORIDE 0.9 % IV SOLN: INTRAVENOUS | Status: AC

## 2013-05-30 MED ORDER — DIAZEPAM 5 MG PO TABS
ORAL_TABLET | ORAL | Status: AC
  Administered 2013-05-30: 5 mg
  Filled 2013-05-30: qty 1

## 2013-05-30 MED ORDER — ASPIRIN EC 325 MG PO TBEC: 325.0000 mg | DELAYED_RELEASE_TABLET | Freq: Every day | ORAL | Status: DC

## 2013-05-30 MED ORDER — NITROGLYCERIN 0.2 MG/ML ON CALL CATH LAB
INTRAVENOUS | Status: AC
  Filled 2013-05-30: qty 1

## 2013-05-30 MED ORDER — DIAZEPAM 5 MG PO TABS
5.0000 mg | ORAL_TABLET | ORAL | Status: DC
Start: 2013-05-30 — End: 2013-05-30

## 2013-05-30 MED ORDER — FENTANYL CITRATE 0.05 MG/ML IJ SOLN
INTRAMUSCULAR | Status: AC
  Filled 2013-05-30: qty 2

## 2013-05-30 MED ORDER — SODIUM CHLORIDE 0.9 % IV SOLN
INTRAVENOUS | Status: DC
  Administered 2013-05-30: 07:00:00 via INTRAVENOUS

## 2013-05-30 NOTE — CV Procedure (Signed)
Kelly [name] is a 58 y.o. female    [id number] LOCATION:  FACILITY: [facility]  PHYSICIAN: Nanetta BattyJonathan Kenitha Glendinning, M.D. 1955-12-19   DATE OF PROCEDURE:  05/30/2013  DATE OF DISCHARGE:     PV Angiogram/Intervention    History obtained from chart review.Kelly [name] is a 58 year old moderately overweight separated [place] female mother of 4 relative to grandchildren was accompanied by one of her daughters today. She is referred by Dr. Alvan Dameichard Sikora for peripheral vasodilation because of claudication. Her cardiovascular risk factor profile is positive for 20-pack-years of tobacco abuse smoking a pulse of one half pack per day. She has untreated hypertension. She does have a family history of heart disease with a father who died of a myocardial function at age 58 and several brothers who have had heart disease as well. She has never had a heart attack or stroke. She does complain of chest pain which occurs both with some of them sound anginal. She's had claudication for the last 2 years worse in the last 2 months. 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 She has resting leg pain and is unable to ambulate because of this. 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 A Myoview stress test was nonischemic. Based on this I plan on performing abdominal aortography via the right radial approach to further characterize her anatomy and determined that the best method for revascularization.    PROCEDURE DESCRIPTION:   The patient was brought to the second floor [facility] cath lab in the postabsorptive state. She was premedicated with Valium 5 mg by mouth, IV Versed and fentanyl. Her right wristwas prepped and shaved in usual sterile fashion. Xylocaine 1% was used for local  anesthesia. A 5 French sheath was inserted into the right radial artery using standard Seldinger technique. The patient received 4000 units  of heparin  intravenously.  A 5 French 125 cm long straight pigtail catheter was advanced into the descending abdominal aorta and positioned just below the level of the renal arteries. Abdominal aortography and bilateral iliac angiography were then performed using bolus digital subtraction technique. Visipaque dye was she's to the entirety of the case (60 cc). A retrograde aortic pressure was monitored during the case.    HEMODYNAMICS:    AO SYSTOLIC/AO DIASTOLIC: 124/70   Angiographic Data:   1. Abdominal aortogram-abdominal aorta was occluded to below the level of the renal arteries. The left external iliac artery filled by inferior mesenteric and lumbar collaterals. The right common femoral artery filled by a similar collateral system. The renal arteries were widely patent.  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 She would best be treated with aortobifemoral bypass grafting. The sheath was removed and AP arm band was placed on the right wrist which he patent hemostasis. The patient left the lab in stable condition. She'll be discharged on 2 hours he'll be gently hydrated. I will see her back in the office in one to 2 weeks for followup and we'll refer her to Dr. Durene CalWells Brabham for surgical evaluation.    [name],Asami Lambright J. MD, [facility] 05/30/2013 9:51 AM

## 2013-05-30 NOTE — Progress Notes (Addendum)
CLIENT GETTING DRESSED AND CALLED OUT RIGHT RADIAL SITE BLEEDING; CAROL BOPE,RN FROM CATH LAB IN AND TR BAND PLACED AND CALLED BRITTNEY,PA FOR PAIN MED ORDER

## 2013-05-30 NOTE — Progress Notes (Signed)
HARRIOTT,RN NOTIFIED OF OOZING NOTED RIGHT RADIAL AND 2CC  REPLACED IN TR BAND

## 2013-05-30 NOTE — Interval H&P Note (Signed)
History and Physical Interval Note:  05/30/2013 9:02 AM  Kelly [name]  has presented today for surgery, with the diagnosis of claudication  The various methods of treatment have been discussed with the patient and family. After consideration of risks, benefits and other options for treatment, the patient has consented to  Procedure(s): ABDOMINAL AORTAGRAM (N/A) as a surgical intervention .  The patient's history has been reviewed, patient examined, no change in status, stable for surgery.  I have reviewed the patient's chart and labs.  Questions were answered to the patient's satisfaction.     [name],Rox Mcgriff J

## 2013-05-30 NOTE — Progress Notes (Signed)
BRITTNEY,PA IN TO SEE CLIENT AND OK TO D/C HOME PER BRITTNEY

## 2013-05-30 NOTE — H&P (View-Only) (Signed)
05/24/2013 Kelly [name]   03-10-1956  [id number]  Primary Physician No PCP Per Patient Primary Cardiologist: Runell Gess MD Roseanne Reno    HPI:  Kelly [name] is a 58 year old moderately overweight separated African American female mother of 4 relative to grandchildren was accompanied by one of her daughters today. She is referred by Dr. Alvan Dame for peripheral vasodilation because of claudication. Her cardiovascular risk factor profile is positive for 20-pack-years of tobacco abuse smoking a pulse of one half pack per day. She has untreated hypertension. She does have a family history of heart disease with a father who died of a myocardial function at age 43 and several brothers who have had heart disease as well. She has never had a heart attack or stroke. She does complain of chest pain which occurs both with some of them sound anginal. She's had claudication for the last 2 years worse in the last 2 months. 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 She has resting leg pain and is unable to ambulate because of this. 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 A Myoview stress test was nonischemic. Based on this I plan on performing abdominal aortography via the right radial approach to further characterize her anatomy and determined that the best method for revascularization.   No current outpatient prescriptions on file.   No current facility-administered medications for this visit.    No Known Allergies  History   Social History  . Marital Status: Legally Separated    Spouse Name: N/A    Number of Children: N/A  . Years of Education: N/A   Occupational History  . Not on file.   Social History Main Topics  . Smoking status:  Current Every Day Smoker  . Smokeless tobacco: Not on file  . Alcohol Use: Yes  . Drug Use: No  . Sexual Activity: Not on file   Other Topics Concern  . Not on file   Social History Narrative  . No narrative on file     Review of Systems: General: negative for chills, fever, night sweats or weight changes.  Cardiovascular: negative for chest pain, dyspnea on exertion, edema, orthopnea, palpitations, paroxysmal nocturnal dyspnea or shortness of breath Dermatological: negative for rash Respiratory: negative for cough or wheezing Urologic: negative for hematuria Abdominal: negative for nausea, vomiting, diarrhea, bright red blood per rectum, melena, or hematemesis Neurologic: negative for visual changes, syncope, or dizziness All other systems reviewed and are otherwise negative except as noted above.    Blood pressure 160/100, pulse 76, height 5\' 6"  (1.676 m), weight 75.297 kg (166 lb).  General appearance: alert and no distress Neck: no adenopathy, no carotid bruit, no JVD, supple, symmetrical, trachea midline and thyroid not enlarged, symmetric, no tenderness/mass/nodules Lungs: clear to auscultation bilaterally Heart: regular rate and rhythm, S1, S2 normal, no murmur, click, rub or gallop Extremities: extremities normal, atraumatic, no cyanosis or edema and absent pedal pulses  EKG not performed today  ASSESSMENT AND PLAN:   Claudication The CT angiogram showed occlusion of the distal abdominal aorta just above the iliac bifurcation with reconstitution in the external iliac arteries. Prior to recommending best option for revascularization I think that abdominal aortography would be the next option. I'm going to arrange to perform his radially.  Essential hypertension Her blood pressure is elevated today. I would check routine lab work on her and begin her on antihypertensive medication.      Runell GessJonathan J. Vinetta Brach MD FACP,FACC,FAHA, [facility] 05/24/2013 12:30 PM

## 2013-05-30 NOTE — Progress Notes (Signed)
PER BRITTNEY,PA OK TO D/C AT 1430

## 2013-05-30 NOTE — Progress Notes (Signed)
   Pt status right radial cardiac cath today. Pt had recurrent bleeding post removal of right radial TR band. The cath lab team was notified and a right radial TR band was reapplied at 1 pm. Instructions were given to deflate at 2 pm and to remove at 2:30 pm.  PO pain medicine was also ordered be given now for pain.   BRITTAINY SIMMONS, PA-C

## 2013-05-30 NOTE — Discharge Instructions (Signed)

## 2013-06-03 ENCOUNTER — Telehealth: Payer: Self-pay | Admitting: Surgery

## 2013-06-03 NOTE — Telephone Encounter (Signed)
notified patient of appt. with dr. Myra Gianottibrabham on 06-24-13 8:30 am as per staff message 06-01-13, mailed np information

## 2013-06-05 ENCOUNTER — Encounter: Payer: Self-pay | Admitting: Vascular Surgery

## 2013-06-06 ENCOUNTER — Encounter: Payer: Managed Care, Other (non HMO) | Admitting: Vascular Surgery

## 2013-06-06 ENCOUNTER — Telehealth: Payer: Self-pay | Admitting: Cardiovascular Disease

## 2013-06-06 NOTE — Telephone Encounter (Signed)
[facility] wanted to confirm that this patient needed to see Brabham. Yes, Dr Myra GianottiBrabham. please

## 2013-06-07 ENCOUNTER — Encounter: Payer: Self-pay | Admitting: Surgery

## 2013-06-10 ENCOUNTER — Encounter: Payer: Self-pay | Admitting: Surgery

## 2013-06-10 ENCOUNTER — Other Ambulatory Visit: Payer: Self-pay | Admitting: Surgery

## 2013-06-10 ENCOUNTER — Ambulatory Visit (INDEPENDENT_AMBULATORY_CARE_PROVIDER_SITE_OTHER): Payer: Managed Care, Other (non HMO) | Admitting: Surgery

## 2013-06-10 ENCOUNTER — Other Ambulatory Visit: Payer: Self-pay | Admitting: *Deleted

## 2013-06-10 ENCOUNTER — Ambulatory Visit (HOSPITAL_COMMUNITY)
Admission: RE | Admit: 2013-06-10 | Discharge: 2013-06-10 | Disposition: A | Discharge status: Home / Self Care | Payer: Managed Care, Other (non HMO) | Source: Ambulatory Visit | Attending: Surgery | Admitting: Surgery

## 2013-06-10 VITALS — BP 155/94 | HR 65 | Ht 66.0 in | Wt 166.0 lb

## 2013-06-10 DIAGNOSIS — Z01818 Encounter for other preprocedural examination: Secondary | ICD-10-CM | POA: Insufficient documentation

## 2013-06-10 DIAGNOSIS — Z01810 Encounter for preprocedural cardiovascular examination: Secondary | ICD-10-CM

## 2013-06-10 DIAGNOSIS — E7800 Pure hypercholesterolemia, unspecified: Secondary | ICD-10-CM

## 2013-06-10 DIAGNOSIS — I739 Peripheral vascular disease, unspecified: Secondary | ICD-10-CM

## 2013-06-10 MED ORDER — ATORVASTATIN CALCIUM 10 MG PO TABS: 10.0000 mg | ORAL_TABLET | Freq: Every day | ORAL | Status: DC

## 2013-06-10 NOTE — Progress Notes (Signed)
Patient name: Kelly [name] Para MRN: [id number] DOB: [birth year] Sex: female   Referred by: Dr. Allyson SabalBerry  Reason for referral:  Chief Complaint  Patient presents with  . New Evaluation    aorto-bi-fem BP     HISTORY OF PRESENT ILLNESS: This is a very pleasant 58 year old female that was referred for aortic occlusion.  The patient had a CT angiogram in February which shows an infrarenal aortic occlusion with reconstitution of the femoral vessels and no significant outflow a runoff disease.  She has also undergone angiography which confirms the above findings.  The patient states that she can not walk very far before her legs give out.  She also has rest pain in her right leg.  She continues to smoke.  She has been told she has hypertension, but for financial reasons has not been taking her antihypertensive medication.  Past Medical History  Diagnosis Date  . Hypertension   . Claudication   . Tobacco abuse     Past Surgical History  Procedure Laterality Date  . Abdominal hysterectomy      History   Social History  . Marital Status: Legally Separated    Spouse Name: N/A    Number of Children: N/A  . Years of Education: N/A   Occupational History  . Not on file.   Social History Main Topics  . Smoking status: Current Some Day Smoker  . Smokeless tobacco: Never Used     Comment: states she smokes 0-1 cig per day  . Alcohol Use: 0.6 oz/week    1 Cans of beer per week  . Drug Use: No  . Sexual Activity: Not on file   Other Topics Concern  . Not on file   Social History Narrative  . No narrative on file    Family History  Problem Relation Age of Onset  . Hypertension Mother   . Diabetes Mother   . Hyperlipidemia Mother   . Varicose Veins Mother   . Heart disease Father   . Hypertension Father   . Heart attack Father   . Hyperlipidemia Sister   . Hypertension Sister   . Diabetes Brother   . Hypertension Brother   . Heart attack Brother     Allergies as  of 06/10/2013  . (No Known Allergies)    Current Outpatient Prescriptions on File Prior to Visit  Medication Sig Dispense Refill  . aspirin EC 81 MG tablet Take 81 mg by mouth daily as needed for mild pain.      . Multiple Vitamin (MULTIVITAMIN WITH MINERALS) TABS tablet Take 1 tablet by mouth daily.       No current facility-administered medications on file prior to visit.     REVIEW OF SYSTEMS: Please see history of present illness, otherwise negative  PHYSICAL EXAMINATION: General: The patient appears their stated age.  Vital signs are BP 155/94  Pulse 65  Ht 5\' 6"  (1.676 m)  Wt 166 lb (75.297 kg)  BMI 26.81 kg/m2  SpO2 100% HEENT:  No gross abnormalities Pulmonary: Respirations are non-labored Abdomen: Soft and non-tender  Musculoskeletal: There are no major deformities.   Neurologic: No focal weakness or paresthesias are detected, Skin: There are no ulcer or rashes noted. Psychiatric: The patient has normal affect. Cardiovascular: There is a regular rate and rhythm without significant murmur appreciated.  No carotid bruits  Diagnostic Studies: I have reviewed both her CT angiogram and catheter-based angiography which shows aortic occlusion and reconstitution of the common femoral  vessels bilaterally   Medication Changes: I started her on Lipitor 10 mg daily  Assessment:  Aortic occlusion Plan: I discussed with the patient and her daughter the indications for surgery.  I feel that she is having rest pain in her right leg and therefore this needs to be done.  I do not see a minimally invasive approach.  I feel the patient needs an aortobifemoral bypass graft.  She has a large inferior mesenteric artery, so I will plan on reimplanting the inferior mesenteric artery.  We discussed all potential complications with surgery ranging from [employer] 2 intestinal ischemia, lower extremity ischemia, wound complications.  The patient is somewhat apprehensive but realizes that this needs  to be accomplished.  I am starting the patient on a statin today.  She was given a prescription for Lipitor 10 mg.  She needs to have a primary care physician.  I will try to get somebody involved in her care which he is in the hospital.  She will likely need to be reinitiated on blood pressure medication.  She has been cleared to proceed with surgery from cardiology.  This is been scheduled for Thursday, April 2.  She has never had a carotid ultrasound, and therefore I am scheduling for today.     Jorge Ny, M.D. [facility] Office: [phone] Pager:  [phone]

## 2013-06-10 NOTE — Addendum Note (Signed)
Addended by: Melodye PedMANESS-HARRISON, CHANDA C on: 06/10/2013 01:50 PM   Modules accepted: Orders

## 2013-06-11 ENCOUNTER — Other Ambulatory Visit: Payer: Self-pay

## 2013-06-14 ENCOUNTER — Encounter (HOSPITAL_COMMUNITY)
Admission: RE | Admit: 2013-06-14 | Discharge: 2013-06-14 | Disposition: A | Discharge status: Home / Self Care | Payer: Managed Care, Other (non HMO) | Source: Ambulatory Visit | Attending: Surgery | Admitting: Surgery

## 2013-06-14 ENCOUNTER — Encounter (HOSPITAL_COMMUNITY): Payer: Self-pay

## 2013-06-14 DIAGNOSIS — Z01812 Encounter for preprocedural laboratory examination: Secondary | ICD-10-CM | POA: Insufficient documentation

## 2013-06-14 HISTORY — DX: Headache: R51

## 2013-06-14 HISTORY — DX: Melena: K92.1

## 2013-06-14 HISTORY — DX: Hyperlipidemia, unspecified: E78.5

## 2013-06-14 LAB — URINALYSIS, ROUTINE W REFLEX MICROSCOPIC
Bilirubin Urine: NEGATIVE
GLUCOSE, UA: NEGATIVE mg/dL
Hgb urine dipstick: NEGATIVE
Ketones, ur: NEGATIVE mg/dL
LEUKOCYTES UA: NEGATIVE
Nitrite: NEGATIVE
PH: 6 (ref 5.0–8.0)
Protein, ur: NEGATIVE mg/dL
Specific Gravity, Urine: 1.008 (ref 1.005–1.030)
Urobilinogen, UA: 0.2 mg/dL (ref 0.0–1.0)

## 2013-06-14 LAB — PROTIME-INR
INR: 0.92 (ref 0.00–1.49)
Prothrombin Time: 12.2 seconds (ref 11.6–15.2)

## 2013-06-14 LAB — COMPREHENSIVE METABOLIC PANEL
ALT: 14 U/L (ref 0–35)
AST: 21 U/L (ref 0–37)
Albumin: 3.6 g/dL (ref 3.5–5.2)
Alkaline Phosphatase: 64 U/L (ref 39–117)
BUN: 8 mg/dL (ref 6–23)
CO2: 19 mEq/L (ref 19–32)
CREATININE: 0.62 mg/dL (ref 0.50–1.10)
Calcium: 9.6 mg/dL (ref 8.4–10.5)
Chloride: 106 mEq/L (ref 96–112)
GLUCOSE: 93 mg/dL (ref 70–99)
Potassium: 3.9 mEq/L (ref 3.7–5.3)
Sodium: 141 mEq/L (ref 137–147)
Total Bilirubin: 0.3 mg/dL (ref 0.3–1.2)
Total Protein: 7.6 g/dL (ref 6.0–8.3)

## 2013-06-14 LAB — BLOOD GAS, ARTERIAL
ACID-BASE DEFICIT: 1.2 mmol/L (ref 0.0–2.0)
BICARBONATE: 22.1 meq/L (ref 20.0–24.0)
Drawn by: 344381
FIO2: 0.21 %
O2 Saturation: 97 %
PH ART: 7.458 — AB (ref 7.350–7.450)
PO2 ART: 83.9 mmHg (ref 80.0–100.0)
Patient temperature: 98.6
TCO2: 23.1 mmol/L (ref 0–100)
pCO2 arterial: 31.7 mmHg — ABNORMAL LOW (ref 35.0–45.0)

## 2013-06-14 LAB — APTT: aPTT: 27 seconds (ref 24–37)

## 2013-06-14 LAB — CBC
HCT: 35.9 % — ABNORMAL LOW (ref 36.0–46.0)
HEMOGLOBIN: 12.5 g/dL (ref 12.0–15.0)
MCH: 27.4 pg (ref 26.0–34.0)
MCHC: 34.8 g/dL (ref 30.0–36.0)
MCV: 78.7 fL (ref 78.0–100.0)
Platelets: 284 10*3/uL (ref 150–400)
RBC: 4.56 MIL/uL (ref 3.87–5.11)
RDW: 15.4 % (ref 11.5–15.5)
WBC: 6.5 10*3/uL (ref 4.0–10.5)

## 2013-06-14 LAB — SURGICAL PCR SCREEN
MRSA, PCR: NEGATIVE
STAPHYLOCOCCUS AUREUS: NEGATIVE

## 2013-06-14 LAB — ABO/RH: ABO/RH(D): AB POS

## 2013-06-14 NOTE — Pre-Procedure Instructions (Signed)
Kelly [name]  06/14/2013   Your procedure is scheduled on:  Thursday, April 2nd   [facility] 3 at  5:30 AM.  Call this number if you have problems the morning of surgery: [phone]   Remember:   Do not eat food or drink liquids after midnight Wednesday.   Take these medicines the morning of surgery with A SIP OF WATER: none   Do not wear jewelry, make-up or nail polish.  Do not wear lotions, powders, or perfumes. You may NOT wear deodorant.  Do not shave 48 hours prior to surgery.    Do not bring valuables to the hospital.  [name] any belongings or valuables.               Contacts, dentures or bridgework may not be worn into surgery.  Leave suitcase in the car. After surgery it may be brought to your room.  For patients admitted to the hospital, discharge time is determined by your treatment team.             Name and phone number of your driver:    Special Instructions: "Preparing for Surgery" instruction sheet.    Please read over the following fact sheets that you were given: Pain Booklet, Blood Transfusion Information, MRSA Information and Surgical Site Infection Prevention

## 2013-06-14 NOTE — Progress Notes (Signed)
She states that she has seen blood in her stool occasionally...has had no further work up. Denies any cardiac issues.   Has not been sleeping hardly at all for last couple of months d/t the claudication in legs at night.  DA

## 2013-06-19 MED ORDER — CHLORHEXIDINE GLUCONATE 4 % EX LIQD
60.0000 mL | Freq: Once | CUTANEOUS | Status: DC
  Filled 2013-06-19: qty 60

## 2013-06-19 MED ORDER — DEXTROSE 5 % IV SOLN
1.5000 g | INTRAVENOUS | Status: AC
  Administered 2013-06-20 (×2): 1.5 g via INTRAVENOUS
  Filled 2013-06-19: qty 1.5

## 2013-06-19 MED ORDER — SODIUM CHLORIDE 0.9 % IV SOLN
INTRAVENOUS | Status: DC
  Administered 2013-06-20: 13:00:00 via INTRAVENOUS

## 2013-06-19 NOTE — Progress Notes (Signed)
Contacted pt with surgery time change. Instructed pt to be here tomorrow at 7:30 AM for a 9:30 surgery start time. Pt voiced understanding.

## 2013-06-20 ENCOUNTER — Encounter (HOSPITAL_COMMUNITY): Payer: Self-pay | Admitting: Anesthesiology

## 2013-06-20 ENCOUNTER — Inpatient Hospital Stay (HOSPITAL_COMMUNITY)
Admission: RE | Admit: 2013-06-20 | Discharge: 2013-06-26 | DRG: 238 | Disposition: A | Discharge status: Home / Self Care | Payer: Managed Care, Other (non HMO) | Source: Ambulatory Visit | Attending: Surgery | Admitting: Surgery

## 2013-06-20 ENCOUNTER — Inpatient Hospital Stay (HOSPITAL_COMMUNITY): Payer: Managed Care, Other (non HMO) | Admitting: Certified Registered"

## 2013-06-20 ENCOUNTER — Encounter (HOSPITAL_COMMUNITY): Payer: Managed Care, Other (non HMO) | Admitting: Certified Registered"

## 2013-06-20 ENCOUNTER — Inpatient Hospital Stay (HOSPITAL_COMMUNITY): Payer: Managed Care, Other (non HMO)

## 2013-06-20 ENCOUNTER — Encounter (HOSPITAL_COMMUNITY)
Admission: RE | Disposition: A | Discharge status: Home / Self Care | Payer: Managed Care, Other (non HMO) | Source: Ambulatory Visit | Attending: Surgery

## 2013-06-20 DIAGNOSIS — Z8249 Family history of ischemic heart disease and other diseases of the circulatory system: Secondary | ICD-10-CM

## 2013-06-20 DIAGNOSIS — I70219 Atherosclerosis of native arteries of extremities with intermittent claudication, unspecified extremity: Secondary | ICD-10-CM

## 2013-06-20 DIAGNOSIS — E785 Hyperlipidemia, unspecified: Secondary | ICD-10-CM | POA: Diagnosis present

## 2013-06-20 DIAGNOSIS — Z7982 Long term (current) use of aspirin: Secondary | ICD-10-CM

## 2013-06-20 DIAGNOSIS — I739 Peripheral vascular disease, unspecified: Secondary | ICD-10-CM | POA: Diagnosis present

## 2013-06-20 DIAGNOSIS — I359 Nonrheumatic aortic valve disorder, unspecified: Principal | ICD-10-CM | POA: Diagnosis present

## 2013-06-20 DIAGNOSIS — I350 Nonrheumatic aortic (valve) stenosis: Secondary | ICD-10-CM | POA: Diagnosis present

## 2013-06-20 DIAGNOSIS — D62 Acute posthemorrhagic anemia: Secondary | ICD-10-CM | POA: Diagnosis not present

## 2013-06-20 DIAGNOSIS — I7409 Other arterial embolism and thrombosis of abdominal aorta: Secondary | ICD-10-CM

## 2013-06-20 DIAGNOSIS — I10 Essential (primary) hypertension: Secondary | ICD-10-CM | POA: Diagnosis present

## 2013-06-20 DIAGNOSIS — F17200 Nicotine dependence, unspecified, uncomplicated: Secondary | ICD-10-CM | POA: Diagnosis present

## 2013-06-20 HISTORY — PX: AORTA - BILATERAL FEMORAL ARTERY BYPASS GRAFT: SHX1175

## 2013-06-20 LAB — BLOOD GAS, ARTERIAL
ACID-BASE DEFICIT: 2.6 mmol/L — AB (ref 0.0–2.0)
BICARBONATE: 22 meq/L (ref 20.0–24.0)
O2 Content: 4 L/min
O2 Saturation: 98.8 %
PATIENT TEMPERATURE: 97.7
PH ART: 7.362 (ref 7.350–7.450)
TCO2: 23.3 mmol/L (ref 0–100)
pCO2 arterial: 39.5 mmHg (ref 35.0–45.0)
pO2, Arterial: 127 mmHg — ABNORMAL HIGH (ref 80.0–100.0)

## 2013-06-20 LAB — POCT I-STAT 7, (LYTES, BLD GAS, ICA,H+H)
ACID-BASE DEFICIT: 3 mmol/L — AB (ref 0.0–2.0)
Acid-base deficit: 2 mmol/L (ref 0.0–2.0)
Bicarbonate: 22.9 mEq/L (ref 20.0–24.0)
Bicarbonate: 22.7 mEq/L (ref 20.0–24.0)
CALCIUM ION: 1.18 mmol/L (ref 1.12–1.23)
Calcium, Ion: 1.17 mmol/L (ref 1.12–1.23)
HCT: 30 % — ABNORMAL LOW (ref 36.0–46.0)
HCT: 31 % — ABNORMAL LOW (ref 36.0–46.0)
HEMOGLOBIN: 10.2 g/dL — AB (ref 12.0–15.0)
Hemoglobin: 10.5 g/dL — ABNORMAL LOW (ref 12.0–15.0)
O2 Saturation: 99 %
O2 Saturation: 100 %
PCO2 ART: 36.2 mmHg (ref 35.0–45.0)
PH ART: 7.408 (ref 7.350–7.450)
PH ART: 7.36 (ref 7.350–7.450)
PO2 ART: 149 mmHg — AB (ref 80.0–100.0)
PO2 ART: 203 mmHg — AB (ref 80.0–100.0)
POTASSIUM: 3.7 meq/L (ref 3.7–5.3)
Potassium: 3.3 mEq/L — ABNORMAL LOW (ref 3.7–5.3)
Sodium: 140 mEq/L (ref 137–147)
Sodium: 140 mEq/L (ref 137–147)
TCO2: 24 mmol/L (ref 0–100)
TCO2: 24 mmol/L (ref 0–100)
pCO2 arterial: 40.2 mmHg (ref 35.0–45.0)

## 2013-06-20 LAB — BASIC METABOLIC PANEL
BUN: 8 mg/dL (ref 6–23)
CALCIUM: 8.3 mg/dL — AB (ref 8.4–10.5)
CO2: 21 mEq/L (ref 19–32)
Chloride: 105 mEq/L (ref 96–112)
Creatinine, Ser: 0.62 mg/dL (ref 0.50–1.10)
Glucose, Bld: 149 mg/dL — ABNORMAL HIGH (ref 70–99)
POTASSIUM: 3.9 meq/L (ref 3.7–5.3)
SODIUM: 140 meq/L (ref 137–147)

## 2013-06-20 LAB — PROTIME-INR
INR: 1.24 (ref 0.00–1.49)
Prothrombin Time: 15.3 seconds — ABNORMAL HIGH (ref 11.6–15.2)

## 2013-06-20 LAB — CBC
HCT: 30.3 % — ABNORMAL LOW (ref 36.0–46.0)
Hemoglobin: 10.3 g/dL — ABNORMAL LOW (ref 12.0–15.0)
MCH: 27.1 pg (ref 26.0–34.0)
MCHC: 34 g/dL (ref 30.0–36.0)
MCV: 79.7 fL (ref 78.0–100.0)
Platelets: 197 10*3/uL (ref 150–400)
RBC: 3.8 MIL/uL — ABNORMAL LOW (ref 3.87–5.11)
RDW: 15.3 % (ref 11.5–15.5)
WBC: 10.6 10*3/uL — ABNORMAL HIGH (ref 4.0–10.5)

## 2013-06-20 LAB — APTT: APTT: 27 s (ref 24–37)

## 2013-06-20 LAB — MAGNESIUM: Magnesium: 1.6 mg/dL (ref 1.5–2.5)

## 2013-06-20 SURGERY — CREATION, BYPASS, ARTERIAL, AORTA TO FEMORAL, BILATERAL, USING GRAFT
Anesthesia: General | Site: Abdomen

## 2013-06-20 MED ORDER — SODIUM CHLORIDE 0.9 % IR SOLN
Status: DC | PRN
  Administered 2013-06-20: 10:00:00

## 2013-06-20 MED ORDER — NITROGLYCERIN 0.2 MG/ML ON CALL CATH LAB
INTRAVENOUS | Status: DC | PRN
  Administered 2013-06-20 (×3): 20 ug via INTRAVENOUS

## 2013-06-20 MED ORDER — NEOSTIGMINE METHYLSULFATE 1 MG/ML IJ SOLN
INTRAMUSCULAR | Status: AC
Start: 2013-06-20 — End: 2013-06-20
  Filled 2013-06-20: qty 10

## 2013-06-20 MED ORDER — PHENYLEPHRINE HCL 10 MG/ML IJ SOLN
10.0000 mg | INTRAMUSCULAR | Status: DC | PRN
  Administered 2013-06-20: 15 ug/min via INTRAVENOUS

## 2013-06-20 MED ORDER — LACTATED RINGERS IV SOLN
INTRAVENOUS | Status: DC | PRN
  Administered 2013-06-20 (×2): via INTRAVENOUS

## 2013-06-20 MED ORDER — HEPARIN SODIUM (PORCINE) 1000 UNIT/ML IJ SOLN
INTRAMUSCULAR | Status: DC | PRN
  Administered 2013-06-20: 2000 [IU] via INTRAVENOUS
  Administered 2013-06-20: 7000 [IU] via INTRAVENOUS
  Administered 2013-06-20: 1000 [IU] via INTRAVENOUS

## 2013-06-20 MED ORDER — DOPAMINE-DEXTROSE 3.2-5 MG/ML-% IV SOLN: 3.0000 ug/kg/min | INTRAVENOUS | Status: DC

## 2013-06-20 MED ORDER — GLYCOPYRROLATE 0.2 MG/ML IJ SOLN
INTRAMUSCULAR | Status: DC | PRN
  Administered 2013-06-20: 0.6 mg via INTRAVENOUS

## 2013-06-20 MED ORDER — PROTAMINE SULFATE 10 MG/ML IV SOLN
INTRAVENOUS | Status: AC
  Filled 2013-06-20: qty 5

## 2013-06-20 MED ORDER — LACTATED RINGERS IV SOLN
INTRAVENOUS | Status: DC | PRN
  Administered 2013-06-20 (×2): via INTRAVENOUS

## 2013-06-20 MED ORDER — ARTIFICIAL TEARS OP OINT
TOPICAL_OINTMENT | OPHTHALMIC | Status: AC
  Filled 2013-06-20: qty 3.5

## 2013-06-20 MED ORDER — ONDANSETRON HCL 4 MG/2ML IJ SOLN
INTRAMUSCULAR | Status: DC | PRN
  Administered 2013-06-20: 4 mg via INTRAVENOUS

## 2013-06-20 MED ORDER — HYDROMORPHONE HCL PF 1 MG/ML IJ SOLN
0.2500 mg | INTRAMUSCULAR | Status: DC | PRN
  Administered 2013-06-20 (×4): 0.5 mg via INTRAVENOUS

## 2013-06-20 MED ORDER — ONDANSETRON HCL 4 MG/2ML IJ SOLN: 4.0000 mg | Freq: Four times a day (QID) | INTRAMUSCULAR | Status: DC | PRN

## 2013-06-20 MED ORDER — HEPARIN SODIUM (PORCINE) 1000 UNIT/ML IJ SOLN
INTRAMUSCULAR | Status: AC
  Filled 2013-06-20: qty 1

## 2013-06-20 MED ORDER — EPHEDRINE SULFATE 50 MG/ML IJ SOLN
INTRAMUSCULAR | Status: AC
  Filled 2013-06-20: qty 1

## 2013-06-20 MED ORDER — ACETAMINOPHEN 650 MG RE SUPP: 325.0000 mg | RECTAL | Status: DC | PRN

## 2013-06-20 MED ORDER — VECURONIUM BROMIDE 10 MG IV SOLR
INTRAVENOUS | Status: DC | PRN
  Administered 2013-06-20 (×2): 2 mg via INTRAVENOUS
  Administered 2013-06-20: 1 mg via INTRAVENOUS
  Administered 2013-06-20 (×2): 2 mg via INTRAVENOUS
  Administered 2013-06-20 (×2): 3 mg via INTRAVENOUS
  Administered 2013-06-20: 1 mg via INTRAVENOUS
  Administered 2013-06-20: 2 mg via INTRAVENOUS

## 2013-06-20 MED ORDER — LIDOCAINE HCL (CARDIAC) 20 MG/ML IV SOLN
INTRAVENOUS | Status: DC | PRN
  Administered 2013-06-20: 60 mg via INTRAVENOUS

## 2013-06-20 MED ORDER — ACETAMINOPHEN 325 MG PO TABS: 325.0000 mg | ORAL_TABLET | ORAL | Status: DC | PRN

## 2013-06-20 MED ORDER — VECURONIUM BROMIDE 10 MG IV SOLR
INTRAVENOUS | Status: AC
  Filled 2013-06-20: qty 10

## 2013-06-20 MED ORDER — DOCUSATE SODIUM 100 MG PO CAPS: 100.0000 mg | ORAL_CAPSULE | Freq: Every day | ORAL | Status: DC

## 2013-06-20 MED ORDER — PANTOPRAZOLE SODIUM 40 MG PO TBEC: 40.0000 mg | DELAYED_RELEASE_TABLET | Freq: Every day | ORAL | Status: DC

## 2013-06-20 MED ORDER — GUAIFENESIN-DM 100-10 MG/5ML PO SYRP: 15.0000 mL | ORAL_SOLUTION | ORAL | Status: DC | PRN

## 2013-06-20 MED ORDER — ZOLPIDEM TARTRATE 5 MG PO TABS: 5.0000 mg | ORAL_TABLET | Freq: Once | ORAL | Status: DC

## 2013-06-20 MED ORDER — PHENOL 1.4 % MT LIQD: 1.0000 | OROMUCOSAL | Status: DC | PRN

## 2013-06-20 MED ORDER — HYDRALAZINE HCL 20 MG/ML IJ SOLN
10.0000 mg | INTRAMUSCULAR | Status: DC | PRN
Start: 2013-06-20 — End: 2013-06-26
  Administered 2013-06-20: 10 mg via INTRAVENOUS
  Filled 2013-06-20: qty 1

## 2013-06-20 MED ORDER — GLYCOPYRROLATE 0.2 MG/ML IJ SOLN
INTRAMUSCULAR | Status: AC
  Filled 2013-06-20: qty 3

## 2013-06-20 MED ORDER — LABETALOL HCL 5 MG/ML IV SOLN
10.0000 mg | INTRAVENOUS | Status: DC | PRN
  Filled 2013-06-20: qty 4

## 2013-06-20 MED ORDER — METOPROLOL TARTRATE 1 MG/ML IV SOLN: 2.0000 mg | INTRAVENOUS | Status: DC | PRN

## 2013-06-20 MED ORDER — STERILE WATER FOR INJECTION IJ SOLN
INTRAMUSCULAR | Status: AC
  Filled 2013-06-20: qty 40

## 2013-06-20 MED ORDER — ROCURONIUM BROMIDE 50 MG/5ML IV SOLN
INTRAVENOUS | Status: AC
  Filled 2013-06-20: qty 1

## 2013-06-20 MED ORDER — PROPOFOL 10 MG/ML IV BOLUS
INTRAVENOUS | Status: DC | PRN
  Administered 2013-06-20: 20 mg via INTRAVENOUS
  Administered 2013-06-20: 30 mg via INTRAVENOUS
  Administered 2013-06-20: 120 mg via INTRAVENOUS

## 2013-06-20 MED ORDER — FENTANYL CITRATE 0.05 MG/ML IJ SOLN
INTRAMUSCULAR | Status: AC
  Filled 2013-06-20: qty 5

## 2013-06-20 MED ORDER — DEXTROSE 5 % IV SOLN
1.5000 g | Freq: Once | INTRAVENOUS | Status: DC
  Filled 2013-06-20: qty 1.5

## 2013-06-20 MED ORDER — DIPHENHYDRAMINE HCL 12.5 MG/5ML PO ELIX
12.5000 mg | ORAL_SOLUTION | Freq: Four times a day (QID) | ORAL | Status: DC | PRN
Start: 2013-06-20 — End: 2013-06-21
  Filled 2013-06-20: qty 5

## 2013-06-20 MED ORDER — DOPAMINE-DEXTROSE 3.2-5 MG/ML-% IV SOLN
3.0000 ug/kg/min | INTRAVENOUS | Status: DC
Start: 2013-06-20 — End: 2013-06-20

## 2013-06-20 MED ORDER — LIDOCAINE HCL (CARDIAC) 20 MG/ML IV SOLN
INTRAVENOUS | Status: AC
  Filled 2013-06-20: qty 5

## 2013-06-20 MED ORDER — SODIUM CHLORIDE 0.9 % IJ SOLN: 9.0000 mL | INTRAMUSCULAR | Status: DC | PRN

## 2013-06-20 MED ORDER — MAGNESIUM SULFATE 40 MG/ML IJ SOLN
2.0000 g | Freq: Every day | INTRAMUSCULAR | Status: AC | PRN
  Administered 2013-06-20: 2 g via INTRAVENOUS
  Filled 2013-06-20: qty 50

## 2013-06-20 MED ORDER — PHENYLEPHRINE HCL 10 MG/ML IJ SOLN
INTRAMUSCULAR | Status: DC | PRN
  Administered 2013-06-20 (×2): 80 ug via INTRAVENOUS

## 2013-06-20 MED ORDER — ALUM & MAG HYDROXIDE-SIMETH 200-200-20 MG/5ML PO SUSP: 15.0000 mL | ORAL | Status: DC | PRN

## 2013-06-20 MED ORDER — ROCURONIUM BROMIDE 100 MG/10ML IV SOLN
INTRAVENOUS | Status: DC | PRN
  Administered 2013-06-20: 50 mg via INTRAVENOUS

## 2013-06-20 MED ORDER — NEOSTIGMINE METHYLSULFATE 1 MG/ML IJ SOLN
INTRAMUSCULAR | Status: DC | PRN
  Administered 2013-06-20: 4 mg via INTRAVENOUS

## 2013-06-20 MED ORDER — FENTANYL CITRATE 0.05 MG/ML IJ SOLN
INTRAMUSCULAR | Status: DC | PRN
  Administered 2013-06-20 (×2): 100 ug via INTRAVENOUS
  Administered 2013-06-20: 50 ug via INTRAVENOUS
  Administered 2013-06-20: 100 ug via INTRAVENOUS
  Administered 2013-06-20: 50 ug via INTRAVENOUS
  Administered 2013-06-20: 100 ug via INTRAVENOUS
  Administered 2013-06-20 (×3): 50 ug via INTRAVENOUS
  Administered 2013-06-20: 150 ug via INTRAVENOUS
  Administered 2013-06-20: 50 ug via INTRAVENOUS
  Administered 2013-06-20: 100 ug via INTRAVENOUS
  Administered 2013-06-20: 50 ug via INTRAVENOUS

## 2013-06-20 MED ORDER — MORPHINE SULFATE (PF) 1 MG/ML IV SOLN
INTRAVENOUS | Status: DC
  Administered 2013-06-20: 21:00:00 via INTRAVENOUS
  Administered 2013-06-21: 4 mg via INTRAVENOUS
  Administered 2013-06-21: 06:00:00 via INTRAVENOUS
  Administered 2013-06-21: 11 mg via INTRAVENOUS
  Administered 2013-06-21: 12 mg via INTRAVENOUS
  Filled 2013-06-20 (×2): qty 25

## 2013-06-20 MED ORDER — 0.9 % SODIUM CHLORIDE (POUR BTL) OPTIME
TOPICAL | Status: DC | PRN
  Administered 2013-06-20: 3000 mL

## 2013-06-20 MED ORDER — OXYCODONE HCL 5 MG PO TABS: 5.0000 mg | ORAL_TABLET | Freq: Once | ORAL | Status: DC | PRN

## 2013-06-20 MED ORDER — POTASSIUM CHLORIDE CRYS ER 20 MEQ PO TBCR
20.0000 meq | EXTENDED_RELEASE_TABLET | Freq: Every day | ORAL | Status: DC | PRN
  Filled 2013-06-20: qty 2

## 2013-06-20 MED ORDER — NALOXONE HCL 0.4 MG/ML IJ SOLN: 0.4000 mg | INTRAMUSCULAR | Status: DC | PRN

## 2013-06-20 MED ORDER — DEXTROSE 5 % IV SOLN
1.5000 g | Freq: Two times a day (BID) | INTRAVENOUS | Status: AC
  Administered 2013-06-20 – 2013-06-21 (×2): 1.5 g via INTRAVENOUS
  Filled 2013-06-20 (×2): qty 1.5

## 2013-06-20 MED ORDER — SODIUM CHLORIDE 0.9 % IV SOLN: 500.0000 mL | Freq: Once | INTRAVENOUS | Status: AC | PRN

## 2013-06-20 MED ORDER — ALBUMIN HUMAN 5 % IV SOLN
INTRAVENOUS | Status: DC | PRN
  Administered 2013-06-20 (×3): via INTRAVENOUS

## 2013-06-20 MED ORDER — BISACODYL 10 MG RE SUPP: 10.0000 mg | Freq: Every day | RECTAL | Status: DC | PRN

## 2013-06-20 MED ORDER — MIDAZOLAM HCL 5 MG/5ML IJ SOLN
INTRAMUSCULAR | Status: DC | PRN
  Administered 2013-06-20: 2 mg via INTRAVENOUS

## 2013-06-20 MED ORDER — PROPOFOL 10 MG/ML IV BOLUS
INTRAVENOUS | Status: AC
  Filled 2013-06-20: qty 20

## 2013-06-20 MED ORDER — PROTAMINE SULFATE 10 MG/ML IV SOLN
INTRAVENOUS | Status: DC | PRN
  Administered 2013-06-20: 40 mg via INTRAVENOUS
  Administered 2013-06-20: 10 mg via INTRAVENOUS

## 2013-06-20 MED ORDER — LACTATED RINGERS IV SOLN
INTRAVENOUS | Status: DC
  Administered 2013-06-20: 08:00:00 via INTRAVENOUS

## 2013-06-20 MED ORDER — OXYCODONE HCL 5 MG/5ML PO SOLN: 5.0000 mg | Freq: Once | ORAL | Status: DC | PRN

## 2013-06-20 MED ORDER — ONDANSETRON HCL 4 MG/2ML IJ SOLN
INTRAMUSCULAR | Status: AC
  Filled 2013-06-20: qty 2

## 2013-06-20 MED ORDER — ENOXAPARIN SODIUM 40 MG/0.4ML ~~LOC~~ SOLN
40.0000 mg | SUBCUTANEOUS | Status: DC
  Administered 2013-06-21 – 2013-06-26 (×6): 40 mg via SUBCUTANEOUS
  Filled 2013-06-20 (×8): qty 0.4

## 2013-06-20 MED ORDER — MIDAZOLAM HCL 2 MG/2ML IJ SOLN
INTRAMUSCULAR | Status: AC
  Filled 2013-06-20: qty 2

## 2013-06-20 MED ORDER — HEMOSTATIC AGENTS (NO CHARGE) OPTIME
TOPICAL | Status: DC | PRN
  Administered 2013-06-20 (×2): 1 via TOPICAL

## 2013-06-20 MED ORDER — DIPHENHYDRAMINE HCL 50 MG/ML IJ SOLN: 12.5000 mg | Freq: Four times a day (QID) | INTRAMUSCULAR | Status: DC | PRN

## 2013-06-20 MED ORDER — ONDANSETRON HCL 4 MG/2ML IJ SOLN: 4.0000 mg | Freq: Once | INTRAMUSCULAR | Status: DC | PRN

## 2013-06-20 MED ORDER — DEXTROSE-NACL 5-0.45 % IV SOLN
INTRAVENOUS | Status: DC
  Administered 2013-06-20: 20:00:00 via INTRAVENOUS
  Administered 2013-06-21 (×2): 100 mL/h via INTRAVENOUS
  Administered 2013-06-22: 18:00:00 via INTRAVENOUS
  Administered 2013-06-22: 200 mL/h via INTRAVENOUS
  Administered 2013-06-23 (×2): 150 mL/h via INTRAVENOUS
  Administered 2013-06-24 – 2013-06-25 (×3): via INTRAVENOUS

## 2013-06-20 MED ORDER — ARTIFICIAL TEARS OP OINT
TOPICAL_OINTMENT | OPHTHALMIC | Status: DC | PRN
  Administered 2013-06-20: 1 via OPHTHALMIC

## 2013-06-20 SURGICAL SUPPLY — 79 items
ADH SKN CLS APL DERMABOND .7 (GAUZE/BANDAGES/DRESSINGS) ×1
APL SKNCLS STERI-STRIP NONHPOA (GAUZE/BANDAGES/DRESSINGS) ×1
ATTRACTOMAT 16X20 MAGNETIC DRP (DRAPES) ×1 IMPLANT
BENZOIN TINCTURE PRP APPL 2/3 (GAUZE/BANDAGES/DRESSINGS) ×1 IMPLANT
CANISTER SUCTION 2500CC (MISCELLANEOUS) ×2 IMPLANT
CANISTER WOUND CARE 500ML ATS (WOUND CARE) ×1 IMPLANT
CLIP TI MEDIUM 24 (CLIP) ×3 IMPLANT
CLIP TI WIDE RED SMALL 24 (CLIP) ×3 IMPLANT
CONNECTOR Y ATS VAC SYSTEM (MISCELLANEOUS) ×1 IMPLANT
COVER MAYO STAND STRL (DRAPES) ×2 IMPLANT
COVER SURGICAL LIGHT HANDLE (MISCELLANEOUS) ×2 IMPLANT
DERMABOND ADVANCED (GAUZE/BANDAGES/DRESSINGS) ×1
DERMABOND ADVANCED .7 DNX12 (GAUZE/BANDAGES/DRESSINGS) ×2 IMPLANT
DRAPE WARM FLUID 44X44 (DRAPE) ×2 IMPLANT
DRSG VAC ATS SM SENSATRAC (GAUZE/BANDAGES/DRESSINGS) ×1 IMPLANT
ELECT BLADE 4.0 EZ CLEAN MEGAD (MISCELLANEOUS) ×4
ELECT BLADE 6.5 EXT (BLADE) IMPLANT
ELECT CAUTERY BLADE 6.4 (BLADE) ×1 IMPLANT
ELECT REM PT RETURN 9FT ADLT (ELECTROSURGICAL) ×4
ELECTRODE BLDE 4.0 EZ CLN MEGD (MISCELLANEOUS) ×1 IMPLANT
ELECTRODE REM PT RTRN 9FT ADLT (ELECTROSURGICAL) ×1 IMPLANT
GLOVE BIO SURGEON STRL SZ 6.5 (GLOVE) ×2 IMPLANT
GLOVE BIO SURGEON STRL SZ7.5 (GLOVE) ×5 IMPLANT
GLOVE BIOGEL PI IND STRL 6.5 (GLOVE) IMPLANT
GLOVE BIOGEL PI IND STRL 7.0 (GLOVE) IMPLANT
GLOVE BIOGEL PI IND STRL 7.5 (GLOVE) ×1 IMPLANT
GLOVE BIOGEL PI IND STRL 8 (GLOVE) IMPLANT
GLOVE BIOGEL PI INDICATOR 6.5 (GLOVE) ×1
GLOVE BIOGEL PI INDICATOR 7.0 (GLOVE) ×2
GLOVE BIOGEL PI INDICATOR 7.5 (GLOVE) ×5
GLOVE BIOGEL PI INDICATOR 8 (GLOVE) ×2
GLOVE OPTIFIT SS 7.5 STRL LX (GLOVE) ×1 IMPLANT
GLOVE SS BIOGEL STRL SZ 7 (GLOVE) IMPLANT
GLOVE SUPERSENSE BIOGEL SZ 7 (GLOVE) ×2
GLOVE SURG SS PI 7.5 STRL IVOR (GLOVE) ×4 IMPLANT
GOWN STRL REUS W/ TWL LRG LVL3 (GOWN DISPOSABLE) ×2 IMPLANT
GOWN STRL REUS W/ TWL XL LVL3 (GOWN DISPOSABLE) ×1 IMPLANT
GOWN STRL REUS W/TWL LRG LVL3 (GOWN DISPOSABLE) ×10
GOWN STRL REUS W/TWL XL LVL3 (GOWN DISPOSABLE) ×8
GRAFT HEMASHIELD 14X7MM (Vascular Products) ×1 IMPLANT
HEMOSTAT SNOW SURGICEL 2X4 (HEMOSTASIS) ×2 IMPLANT
INSERT FOGARTY 61MM (MISCELLANEOUS) ×2 IMPLANT
INSERT FOGARTY SM (MISCELLANEOUS) ×4 IMPLANT
KIT BASIN OR (CUSTOM PROCEDURE TRAY) ×2 IMPLANT
KIT ROOM TURNOVER OR (KITS) ×2 IMPLANT
LOOP VESSEL MINI RED (MISCELLANEOUS) ×1 IMPLANT
NS IRRIG 1000ML POUR BTL (IV SOLUTION) ×4 IMPLANT
PACK AORTA (CUSTOM PROCEDURE TRAY) ×2 IMPLANT
PAD ARMBOARD 7.5X6 YLW CONV (MISCELLANEOUS) ×4 IMPLANT
PAD NEG PRESSURE SENSATRAC (MISCELLANEOUS) ×1 IMPLANT
PENCIL BUTTON HOLSTER BLD 10FT (ELECTRODE) ×1 IMPLANT
PUNCH AORTIC ROTATE 5MM 8IN (MISCELLANEOUS) ×1 IMPLANT
SUT ETHIBOND 5 LR DA (SUTURE) IMPLANT
SUT PDS AB 1 TP1 54 (SUTURE) ×4 IMPLANT
SUT PROLENE 3 0 SH 48 (SUTURE) ×9 IMPLANT
SUT PROLENE 5 0 C 1 24 (SUTURE) IMPLANT
SUT PROLENE 5 0 C 1 36 (SUTURE) ×1 IMPLANT
SUT PROLENE 5 0 CC1 (SUTURE) ×4 IMPLANT
SUT PROLENE 6 0 CC (SUTURE) ×2 IMPLANT
SUT SILK 2 0 (SUTURE) ×2
SUT SILK 2 0 TIES 17X18 (SUTURE) ×2
SUT SILK 2 0SH CR/8 30 (SUTURE) ×2 IMPLANT
SUT SILK 2-0 18XBRD TIE 12 (SUTURE) IMPLANT
SUT SILK 2-0 18XBRD TIE BLK (SUTURE) ×1 IMPLANT
SUT SILK 3 0 (SUTURE) ×4
SUT SILK 3 0 TIES 17X18 (SUTURE) ×2
SUT SILK 3-0 18XBRD TIE 12 (SUTURE) IMPLANT
SUT SILK 3-0 18XBRD TIE BLK (SUTURE) ×1 IMPLANT
SUT VIC AB 2-0 CT1 27 (SUTURE) ×12
SUT VIC AB 2-0 CT1 TAPERPNT 27 (SUTURE) ×6 IMPLANT
SUT VIC AB 3-0 SH 27 (SUTURE) ×8
SUT VIC AB 3-0 SH 27X BRD (SUTURE) ×4 IMPLANT
SUT VICRYL 4-0 PS2 18IN ABS (SUTURE) ×8 IMPLANT
TOWEL BLUE STERILE X RAY DET (MISCELLANEOUS) ×4 IMPLANT
TOWEL OR 17X24 6PK STRL BLUE (TOWEL DISPOSABLE) ×4 IMPLANT
TOWEL OR 17X26 10 PK STRL BLUE (TOWEL DISPOSABLE) ×4 IMPLANT
TRAY FOLEY CATH 16FRSI W/METER (SET/KITS/TRAYS/PACK) ×2 IMPLANT
TUBE CONNECTING 12X1/4 (SUCTIONS) ×1 IMPLANT
WATER STERILE IRR 1000ML POUR (IV SOLUTION) ×3 IMPLANT

## 2013-06-20 NOTE — Anesthesia Preprocedure Evaluation (Addendum)
Anesthesia Evaluation  Patient identified by MRN, date of birth, ID band Patient awake    Reviewed: Allergy & Precautions, H&P , NPO status , Patient's Chart, lab work & pertinent test results  Airway Mallampati: II TM Distance: >3 FB Neck ROM: Full    Dental  (+) Teeth Intact, Loose, Dental Advisory Given, Poor Dentition,    Pulmonary Current Smoker,  breath sounds clear to auscultation        Cardiovascular hypertension, + Peripheral Vascular Disease Rhythm:Regular Rate:Normal     Neuro/Psych  Headaches,    GI/Hepatic   Endo/Other    Renal/GU      Musculoskeletal   Abdominal   Peds  Hematology   Anesthesia Other Findings Claudication Pt. Advised regarding loose teeth and that they may come out upon intubation.  Reproductive/Obstetrics                      Anesthesia Physical Anesthesia Plan  ASA: III  Anesthesia Plan: General   Post-op Pain Management:    Induction: Intravenous  Airway Management Planned: Oral ETT  Additional Equipment: Arterial line, CVP, PA Cath and Ultrasound Guidance Line Placement  Intra-op Plan:   Post-operative Plan: Extubation in OR  Informed Consent: I have reviewed the patients History and Physical, chart, labs and discussed the procedure including the risks, benefits and alternatives for the proposed anesthesia with the patient or authorized representative who has indicated his/her understanding and acceptance.   Dental advisory given  Plan Discussed with: CRNA, Anesthesiologist and Surgeon  Anesthesia Plan Comments:        Anesthesia Quick Evaluation

## 2013-06-20 NOTE — H&P (View-Only) (Signed)
Patient name: Kelly [name] MRN: [id number] DOB: [birth year] Sex: female   Referred by: Dr. Allyson SabalBerry  Reason for referral:  Chief Complaint  Patient presents with  . New Evaluation    aorto-bi-fem BP     HISTORY OF PRESENT ILLNESS: This is a very pleasant 58 year old female that was referred for aortic occlusion.  The patient had a CT angiogram in February which shows an infrarenal aortic occlusion with reconstitution of the femoral vessels and no significant outflow a runoff disease.  She has also undergone angiography which confirms the above findings.  The patient states that she can not walk very far before her legs give out.  She also has rest pain in her right leg.  She continues to smoke.  She has been told she has hypertension, but for financial reasons has not been taking her antihypertensive medication.  Past Medical History  Diagnosis Date  . Hypertension   . Claudication   . Tobacco abuse     Past Surgical History  Procedure Laterality Date  . Abdominal hysterectomy      History   Social History  . Marital Status: Legally Separated    Spouse Name: N/A    Number of Children: N/A  . Years of Education: N/A   Occupational History  . Not on file.   Social History Main Topics  . Smoking status: Current Some Day Smoker  . Smokeless tobacco: Never Used     Comment: states she smokes 0-1 cig per day  . Alcohol Use: 0.6 oz/week    1 Cans of beer per week  . Drug Use: No  . Sexual Activity: Not on file   Other Topics Concern  . Not on file   Social History Narrative  . No narrative on file    Family History  Problem Relation Age of Onset  . Hypertension Mother   . Diabetes Mother   . Hyperlipidemia Mother   . Varicose Veins Mother   . Heart disease Father   . Hypertension Father   . Heart attack Father   . Hyperlipidemia Sister   . Hypertension Sister   . Diabetes Brother   . Hypertension Brother   . Heart attack Brother     Allergies as  of 06/10/2013  . (No Known Allergies)    Current Outpatient Prescriptions on File Prior to Visit  Medication Sig Dispense Refill  . aspirin EC 81 MG tablet Take 81 mg by mouth daily as needed for mild pain.      . Multiple Vitamin (MULTIVITAMIN WITH MINERALS) TABS tablet Take 1 tablet by mouth daily.       No current facility-administered medications on file prior to visit.     REVIEW OF SYSTEMS: Please see history of present illness, otherwise negative  PHYSICAL EXAMINATION: General: The patient appears their stated age.  Vital signs are BP 155/94  Pulse 65  Ht 5\' 6"  (1.676 m)  Wt 166 lb (75.297 kg)  BMI 26.81 kg/m2  SpO2 100% HEENT:  No gross abnormalities Pulmonary: Respirations are non-labored Abdomen: Soft and non-tender  Musculoskeletal: There are no major deformities.   Neurologic: No focal weakness or paresthesias are detected, Skin: There are no ulcer or rashes noted. Psychiatric: The patient has normal affect. Cardiovascular: There is a regular rate and rhythm without significant murmur appreciated.  No carotid bruits  Diagnostic Studies: I have reviewed both her CT angiogram and catheter-based angiography which shows aortic occlusion and reconstitution of the common femoral  vessels bilaterally   Medication Changes: I started her on Lipitor 10 mg daily  Assessment:  Aortic occlusion Plan: I discussed with the patient and her daughter the indications for surgery.  I feel that she is having rest pain in her right leg and therefore this needs to be done.  I do not see a minimally invasive approach.  I feel the patient needs an aortobifemoral bypass graft.  She has a large inferior mesenteric artery, so I will plan on reimplanting the inferior mesenteric artery.  We discussed all potential complications with surgery ranging from [employer] 2 intestinal ischemia, lower extremity ischemia, wound complications.  The patient is somewhat apprehensive but realizes that this needs  to be accomplished.  I am starting the patient on a statin today.  She was given a prescription for Lipitor 10 mg.  She needs to have a primary care physician.  I will try to get somebody involved in her care which he is in the hospital.  She will likely need to be reinitiated on blood pressure medication.  She has been cleared to proceed with surgery from cardiology.  This is been scheduled for Thursday, April 2.  She has never had a carotid ultrasound, and therefore I am scheduling for today.     Jorge Ny, M.D. [facility] Office: [phone] Pager:  [phone]

## 2013-06-20 NOTE — Transfer of Care (Signed)
Immediate Anesthesia Transfer of Care Note  Patient: Kelly [name]  Procedure(s) Performed: Procedure(s): AORTA BIFEMORAL BYPASS GRAFT WITH REIMPLANTATION OF THE INFERIOR MESENTERIC ARTERY (N/A)  Patient Location: PACU  Anesthesia Type:General  Level of Consciousness: awake, alert  and oriented  Airway & Oxygen Therapy: Patient Spontanous Breathing and Patient connected to face mask oxygen  Post-op Assessment: Report given to PACU RN and Patient moving all extremities X 4  Post vital signs: Reviewed and stable  Complications: No apparent anesthesia complications

## 2013-06-20 NOTE — Progress Notes (Signed)
eLink Physician-Brief Progress Note Patient Name: Kelly [name] DOB: Sep 20, 1955 MRN: [id number]  Date of Service  06/20/2013   HPI/Events of Note   Remus Lofflerambien entered in error for wrong patient  eICU Interventions  Order d/c'd. Med was not given   Intervention Category Minor Interventions: Routine modifications to care plan (e.g. PRN medications for pain, fever)  BYRUM,ROBERT S. 06/20/2013, 11:36 PM

## 2013-06-20 NOTE — Anesthesia Postprocedure Evaluation (Signed)
  Anesthesia Post-op Note  Patient: Kelly [name]  Procedure(s) Performed: Procedure(s): AORTA BIFEMORAL BYPASS GRAFT WITH REIMPLANTATION OF THE INFERIOR MESENTERIC ARTERY (N/A)  Patient Location: PACU  Anesthesia Type:General  Level of Consciousness: awake, alert  and oriented  Airway and Oxygen Therapy: Patient Spontanous Breathing and Patient connected to nasal cannula oxygen  Post-op Pain: mild  Post-op Assessment: Post-op Vital signs reviewed, Patient's Cardiovascular Status Stable, Respiratory Function Stable, Patent Airway, No signs of Nausea or vomiting and Pain level controlled  Post-op Vital Signs: stable  Complications: None

## 2013-06-20 NOTE — Anesthesia Procedure Notes (Signed)
Procedure Name: Intubation Date/Time: 06/20/2013 9:19 AM Performed by: Delia ChimesVOSH, Shulamis Wenberg E Pre-anesthesia Checklist: Patient identified, Emergency Drugs available, Suction available, Patient being monitored and Timeout performed Patient Re-evaluated:Patient Re-evaluated prior to inductionOxygen Delivery Method: Circle system utilized Preoxygenation: Pre-oxygenation with 100% oxygen Intubation Type: IV induction Ventilation: Mask ventilation without difficulty Laryngoscope Size: Mac and 3 Grade View: Grade I Tube type: Oral Number of attempts: 1 Airway Equipment and Method: Stylet Placement Confirmation: ETT inserted through vocal cords under direct vision,  positive ETCO2 and breath sounds checked- equal and bilateral Secured at: 22 cm Tube secured with: Tape Dental Injury: Dental damage  Comments: Pt.'s right lower canine was loose pre-op and pt. Advised that it may come out upon induction.  As blade was inserted, tooth free in the mouth.  Tooth placed in specimen cup with patient sticker.  Oral airway avoided to prevent dental damage.  All other teeth remain intact as per pre-op assessment.

## 2013-06-20 NOTE — Interval H&P Note (Signed)
History and Physical Interval Note:  06/20/2013 8:50 AM  Kelly [name]  has presented today for surgery, with the diagnosis of Aortic Occlusion  The various methods of treatment have been discussed with the patient and family. After consideration of risks, benefits and other options for treatment, the patient has consented to  Procedure(s): AORTA BIFEMORAL BYPASS GRAFT (N/A) as a surgical intervention .  The patient's history has been reviewed, patient examined, no change in status, stable for surgery.  I have reviewed the patient's chart and labs.  Questions were answered to the patient's satisfaction.     Ottilie Wigglesworth IV, V. WELLS

## 2013-06-20 NOTE — Progress Notes (Signed)
eLink Physician-Brief Progress Note Patient Name: Kelly [name] DOB: May 24, 1955 MRN: [id number]  Date of Service  06/20/2013   HPI/Events of Note   Pt requesting her home ambien for sleep.   eICU Interventions  ambien 5mg  x 1 ordered   Intervention Category Minor Interventions: Routine modifications to care plan (e.g. PRN medications for pain, fever)  BYRUM,ROBERT S. 06/20/2013, 11:25 PM

## 2013-06-20 NOTE — Op Note (Signed)
Patient name: Kelly [name] MRN: [id number] DOB: [birth year] Sex: female  06/20/2013 Pre-operative Diagnosis: Bilateral claudication Post-operative diagnosis:  Same Surgeon:  Jorge Ny Assistants:  Narda Amber Procedure:   Aortobifemoral bypass graft using a 14 x 7 dacryon graft with reimplantation of the inferior mesenteric artery Anesthesia:  Gen. Blood Loss:  See anesthesia record Specimens:  None  Findings:  End to end proximal anastamosis.  Bilateral distal anastomoses her to the distal common femoral artery, extending slightly onto the proximal superficial femoral artery.  The inferior mesenteric artery was reimplanted into the tube a portion of the 14 x 7 Dacron graft.  The patient had a retroaortic left renal vein.  She had a significant amount of lymphatic drainage within the abdomen and femoral incisions.  This did try up significantly by the end of the procedure.  I placed the incisional plaques in both groins.  Indications:  The patient has debilitating bilateral claudication bordering on rest pain keeping her up most of the night.  Imaging studies reveal an occluded infrarenal aorta.  She comes in today for revascularization.  Procedure:  The patient was identified in the holding area and taken to [facility] OR ROOM 11  The patient was then placed supine on the table. general anesthesia was administered.  The patient was prepped and draped in the usual sterile fashion.  A time out was called and antibiotics were administered.  Bilateral longitudinal femoral incisions were made.  Cautery was used to divide through the subcutaneous tissue down to the femoral sheath.  The femoral sheaths were opened bilaterally.  The common femoral, superficial femoral, and profunda femoral arteries were each individually isolated.  I dissected up under the inguinal ligament and ligated the crossing vein bilaterally.  Both groins were then packed with moist gauze.  Attention was then turned  towards the abdomen.  A midline incision was made from the xiphoid to below the umbilicus.  Cautery was used to divide the tissue down to the fascia.  The fascia was opened in the midline.  The peritoneal cavity was then entered sharply.  The abdomen was then exposed throughout the length of the incision.  There was no gross pathology within the abdomen.  A Balfour retractor was placed.  The Omni tract was then set up.  The small bowel was mobilized to the patient's right after reflecting the transverse colon cephalad.  The ligament of Treitz was divided with cautery.  The aorta was then exposed.  I isolated the inferior mesenteric artery.  I created a tunnel between the abdominal and femoral incisions.  The tunnel was directly anterior to the iliac arteries bilaterally, going posterior to the ureters.  I then dissected out the infrarenal aorta.  The patient had an accessory renal artery bilaterally.  The dissection was somewhat tedious given the retroaortic left renal vein.  Ultimately I was able to get circumferential exposure of the aorta at the level of the renal arteries.  At this point, the patient was fully heparinized.  After the heparin circulated a Harken clamp was placed proximally on the infrarenal aorta and a aortic DeBakey clamp was placed distally.  I then transected the aorta.  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  The distal aorta was then oversewn in 2 layers with 3-0 Prolene.  The DeBakey clamp was then removed.  I then removed thrombus from the proximal artery.  I selected a 14 x 7 bifurcated dacryon graft.  I  performed the anastomosis in a end to end fashion.  The proximal anastomosis incorporated a felt strip.  A 3 horizontal mattress 3-0 Prolene sutures were placed, running the lateral sutures circumferentially.  Once the anastomosis was completed the proximal clamp was removed.  Each limb of the graft was brought through the respective groin making sure to maintain  proper orientation of the graft.  I initially perform the right femoral anastomosis first.  The right common femoral artery was occluded with a Hanley clamp.  The profunda and superficial femoral artery were occluded with a baby Gregory clamps.  A longitudinal arteriotomy was made with a #11 blade and extended with Potts scissors.  The arteriotomy went approximately 1 mm onto the superficial femoral artery.  The graft was cut to the appropriate length and then beveled to fit the size of the arteriotomy.  The patient had very small femoral arteries.  A running anastomosis was created with 5-0 Prolene.  Prior to completion the appropriate flushing maneuvers were performed, and the anastomosis was completed.  Blood flow was then est. to the right leg.  Similarly in the left groin, the common femoral, profunda femoral and superficial femoral arteries were occluded.  A #11 blade was used to make an arteriotomy which was extended longitudinally with Potts scissors.  The graft was cut to the appropriate length and bevel to fit the size of the arteriotomy.  This arteriotomy terminated 1 mm onto the superficial femoral artery.  A running anastomosis was created with 5-0 Prolene.  Prior to completion, the appropriate flushing maneuvers were performed, and blood flow was reestablished to the left leg.  There were excellent Doppler signals in the profunda femoral and superficial femoral artery bilaterally.  Next, attention was turned back toward the abdomen.  Because of the size of the inferior mesenteric artery I elected to reimplant it.  Because of the location of the inferior mesenteric artery, I felt it would best fit in the midportion of the tubular dacryon graft.  This portion of the graft was occluded with a Satinsky clamp.  A #11 blade was used to cut the graft on the left lateral side.  I then opened the graftotomy with a #5 punch.  The inferior mesenteric artery was ligated at its origin.  A bulldog clamp was placed  distally.  I then spatulated the inferior mesenteric artery and performed a end to side anastomosis with 6-0 Prolene.  Once the anastomosis and flushing maneuvers were complete, the clamp was released.  There was excellent Doppler signal within the inferior mesenteric artery.  At this 0.50 mg of protamine was administered.  Once I was satisfied with hemostasis, the retroperitoneal tissue was reapproximated with a running 2-0 Vicryl suture.  I then placed the bowel back into its anatomic position.  I inspected the bowel.  All was well perfused.  There had been a mesenteric defect encountered initially.  I reinspected this area and it looked healthy.  I then closed the abdominal wall fascia with 2 running #1 PDS suture.  The subcutaneous tissue was closed with 2-0 Vicryl the skin with 4-0 Vicryl.  The groins were then irrigated.  The femoral sheaths were reapproximated with 2-0 Vicryl.  Several additional layers of 20 and 3-0 Vicryl were placed followed by a 4-0 Vicryl and the skin.  I elected to place an incisional faxed in both groin incisions due to 2 the amount of serous lymphatic drainage that was continuously present.  The patient was then successfully extubated and  taken to recovery in stable condition.   Disposition:  To PACU in stable condition.   Juleen China, M.D. [facility] [place] Office: [phone] Pager:  [phone]

## 2013-06-21 ENCOUNTER — Inpatient Hospital Stay (HOSPITAL_COMMUNITY): Payer: Managed Care, Other (non HMO)

## 2013-06-21 LAB — COMPREHENSIVE METABOLIC PANEL
ALK PHOS: 40 U/L (ref 39–117)
ALT: 15 U/L (ref 0–35)
AST: 23 U/L (ref 0–37)
Albumin: 3.2 g/dL — ABNORMAL LOW (ref 3.5–5.2)
BUN: 7 mg/dL (ref 6–23)
CO2: 21 meq/L (ref 19–32)
Calcium: 8.1 mg/dL — ABNORMAL LOW (ref 8.4–10.5)
Chloride: 100 mEq/L (ref 96–112)
Creatinine, Ser: 0.61 mg/dL (ref 0.50–1.10)
GFR calc non Af Amer: >90 mL/min (ref >90)
GLUCOSE: 134 mg/dL — AB (ref 70–99)
POTASSIUM: 3.7 meq/L (ref 3.7–5.3)
SODIUM: 134 meq/L — AB (ref 137–147)
TOTAL PROTEIN: 6.2 g/dL (ref 6.0–8.3)
Total Bilirubin: 0.6 mg/dL (ref 0.3–1.2)

## 2013-06-21 LAB — CBC
HEMATOCRIT: 30 % — AB (ref 36.0–46.0)
HEMOGLOBIN: 10.3 g/dL — AB (ref 12.0–15.0)
MCH: 27.2 pg (ref 26.0–34.0)
MCHC: 34.3 g/dL (ref 30.0–36.0)
MCV: 79.4 fL (ref 78.0–100.0)
Platelets: 182 10*3/uL (ref 150–400)
RBC: 3.78 MIL/uL — AB (ref 3.87–5.11)
RDW: 15.4 % (ref 11.5–15.5)
WBC: 5.8 10*3/uL (ref 4.0–10.5)

## 2013-06-21 LAB — AMYLASE: Amylase: 77 U/L (ref 0–105)

## 2013-06-21 LAB — MAGNESIUM: Magnesium: 2 mg/dL (ref 1.5–2.5)

## 2013-06-21 MED ORDER — DIPHENHYDRAMINE HCL 50 MG/ML IJ SOLN: 12.5000 mg | Freq: Four times a day (QID) | INTRAMUSCULAR | Status: DC | PRN

## 2013-06-21 MED ORDER — BIOTENE DRY MOUTH MT LIQD
15.0000 mL | Freq: Two times a day (BID) | OROMUCOSAL | Status: DC
  Administered 2013-06-21 – 2013-06-25 (×7): 15 mL via OROMUCOSAL

## 2013-06-21 MED ORDER — CHLORHEXIDINE GLUCONATE 0.12 % MT SOLN
15.0000 mL | Freq: Two times a day (BID) | OROMUCOSAL | Status: DC
  Administered 2013-06-21 – 2013-06-26 (×9): 15 mL via OROMUCOSAL
  Filled 2013-06-21 (×12): qty 15

## 2013-06-21 MED ORDER — DOCUSATE SODIUM 50 MG/5ML PO LIQD
100.0000 mg | Freq: Every day | ORAL | Status: DC
  Administered 2013-06-21 – 2013-06-26 (×4): 100 mg via ORAL
  Filled 2013-06-21 (×6): qty 10

## 2013-06-21 MED ORDER — FUROSEMIDE 10 MG/ML IJ SOLN
INTRAMUSCULAR | Status: AC
  Filled 2013-06-21: qty 2

## 2013-06-21 MED ORDER — DIPHENHYDRAMINE HCL 12.5 MG/5ML PO ELIX: 12.5000 mg | ORAL_SOLUTION | Freq: Four times a day (QID) | ORAL | Status: DC | PRN

## 2013-06-21 MED ORDER — MORPHINE SULFATE (PF) 1 MG/ML IV SOLN
INTRAVENOUS | Status: DC
  Administered 2013-06-21: 9 mg via INTRAVENOUS
  Administered 2013-06-21: 7.5 mg via INTRAVENOUS
  Administered 2013-06-21: 5.1 mg via INTRAVENOUS
  Administered 2013-06-21: 1.5 mg via INTRAVENOUS
  Administered 2013-06-22 (×2): 4.5 mg via INTRAVENOUS
  Administered 2013-06-22: 3 mg via INTRAVENOUS
  Administered 2013-06-22: 1.5 mg via INTRAVENOUS
  Administered 2013-06-22: via INTRAVENOUS
  Administered 2013-06-22: 2 mg via INTRAVENOUS
  Administered 2013-06-22: 6 mg via INTRAVENOUS
  Administered 2013-06-23: 3 mg via INTRAVENOUS
  Administered 2013-06-23: 18:00:00 via INTRAVENOUS
  Administered 2013-06-23: 3 mg via INTRAVENOUS
  Administered 2013-06-23 (×2): 1.5 mg via INTRAVENOUS
  Administered 2013-06-24: 0.1 mL via INTRAVENOUS
  Administered 2013-06-24: 5 mg via INTRAVENOUS
  Filled 2013-06-21 (×3): qty 25

## 2013-06-21 MED ORDER — ONDANSETRON HCL 4 MG/2ML IJ SOLN: 4.0000 mg | Freq: Four times a day (QID) | INTRAMUSCULAR | Status: DC | PRN

## 2013-06-21 MED ORDER — SODIUM CHLORIDE 0.9 % IJ SOLN: 9.0000 mL | INTRAMUSCULAR | Status: DC | PRN

## 2013-06-21 MED ORDER — PANTOPRAZOLE SODIUM 40 MG PO PACK
40.0000 mg | PACK | Freq: Every day | ORAL | Status: DC
  Administered 2013-06-21 – 2013-06-26 (×5): 40 mg
  Filled 2013-06-21 (×8): qty 20

## 2013-06-21 MED ORDER — NALOXONE HCL 0.4 MG/ML IJ SOLN: 0.4000 mg | INTRAMUSCULAR | Status: DC | PRN

## 2013-06-21 MED ORDER — HYDROMORPHONE 0.3 MG/ML IV SOLN: INTRAVENOUS | Status: DC

## 2013-06-21 MED ORDER — HETASTARCH-ELECTROLYTES 6 % IV SOLN
500.0000 mL | Freq: Once | INTRAVENOUS | Status: AC
  Administered 2013-06-21: 500 mL via INTRAVENOUS
  Filled 2013-06-21: qty 500

## 2013-06-21 MED ORDER — KETOROLAC TROMETHAMINE 30 MG/ML IJ SOLN
30.0000 mg | Freq: Four times a day (QID) | INTRAMUSCULAR | Status: AC
  Administered 2013-06-21 – 2013-06-24 (×11): 30 mg via INTRAVENOUS
  Filled 2013-06-21 (×12): qty 1

## 2013-06-21 MED ORDER — DIPHENHYDRAMINE HCL 12.5 MG/5ML PO ELIX
12.5000 mg | ORAL_SOLUTION | Freq: Four times a day (QID) | ORAL | Status: DC | PRN
  Filled 2013-06-21: qty 5

## 2013-06-21 MED ORDER — FUROSEMIDE 10 MG/ML IJ SOLN
20.0000 mg | Freq: Once | INTRAMUSCULAR | Status: AC
  Administered 2013-06-21: 20 mg via INTRAVENOUS

## 2013-06-21 MED FILL — Heparin Sodium (Porcine) Inj 1000 Unit/ML: INTRAMUSCULAR | Qty: 30 | Status: AC

## 2013-06-21 MED FILL — Sodium Chloride IV Soln 0.9%: INTRAVENOUS | Qty: 2000 | Status: AC

## 2013-06-21 NOTE — Evaluation (Addendum)
Occupational Therapy Evaluation Patient Details Name: VICKE PLOTNER MRN: [id number] DOB: 01-27-1956 Today's Date: 06/21/2013    History of Present Illness Aortobifemoral bypass graft using a 14 x 7 dacryon graft with reimplantation of the inferior mesenteric artery with Bil groin vacs   Clinical Impression   This 58 yo female Independent pta presents to acute OT with increased pain, decreased mobility, bil groin wounds with vacs, abdominal wound all affecting pts ability to care for herself. Will benefit from acute OT with follow up HHOT. Will continue to follow.    Follow Up Recommendations  Home health OT    Equipment Recommendations  3 in 1 bedside comode       Precautions / Restrictions Precautions Precautions: Fall Precaution Comments: adominal wound, Bil groin wound vacs Restrictions Weight Bearing Restrictions: No      Mobility Bed Mobility Overal bed mobility: Needs Assistance;+2 for physical assistance Bed Mobility: Supine to Sit     Supine to sit: Mod assist;+2 for physical assistance        Transfers Overall transfer level: Needs assistance   Transfers: Sit to/from Stand Sit to Stand: Mod assist              Balance Overall balance assessment: Needs assistance Sitting-balance support: Feet supported;Bilateral upper extremity supported Sitting balance-Leahy Scale: Fair     Standing balance support: Bilateral upper extremity supported Standing balance-Leahy Scale: Fair                              ADL Overall ADL's : Needs assistance/impaired Eating/Feeding: NPO   Grooming: Set up;Sitting   Upper Body Bathing: Minimal assitance;Sitting   Lower Body Bathing: Moderate assistance;Sit to/from stand   Upper Body Dressing : Moderate assistance;Sitting   Lower Body Dressing: Total assistance;Sit to/from stand   Toilet Transfer: Moderate assistance;Ambulation (bed>out door and down hallway>sit in recliner behind her)    Toileting- Architect and Hygiene: Moderate assistance;Sit to/from stand       Functional mobility during ADLs: Minimal assistance                 Pertinent Vitals/Pain 8/10 pain, abdomen, encouraged PCA     Hand Dominance Right   Extremity/Trunk Assessment Upper Extremity Assessment Upper Extremity Assessment: Overall WFL for tasks assessed           Communication Communication Communication: No difficulties   Cognition Arousal/Alertness: Awake/alert Behavior During Therapy: WFL for tasks assessed/performed Overall Cognitive Status: Within Functional Limits for tasks assessed                                Home Living Family/patient expects to be discharged to:: Private residence Living Arrangements: Children Available Help at Discharge: Family;Available PRN/intermittently Type of Home: House Home Access: Stairs to enter [employer] of Steps: 4 Entrance Stairs-Rails: Right Home Layout: One level     Bathroom Shower/Tub: Walk-in shower;Door   [employer] Toilet: Standard     Home Equipment: None          Prior Functioning/Environment Level of Independence: Independent             OT Diagnosis: Generalized weakness;Acute pain   OT Problem List: Decreased strength;Impaired balance (sitting and/or standing);Pain;Decreased knowledge of use of DME or AE   OT Treatment/Interventions: Self-care/ADL training;Therapeutic activities;Patient/family education;Balance training;DME and/or AE instruction    OT Goals(Current goals can be found in he  care plan section) Acute Rehab OT Goals OT Goal Formulation: With patient Time For Goal Achievement: 07/05/13 Potential to Achieve Goals: Good ADL Goals Pt Will Perform Grooming: with set-up;with supervision;standing Pt Will Perform Lower Body Bathing: with set-up;with supervision;sit to/from stand;with adaptive equipment Pt Will Perform Lower Body Dressing: with set-up;with  supervision;sit to/from stand;with adaptive equipment Pt Will Transfer to Toilet: with supervision;ambulating;bedside commode (over toilet) Pt Will Perform Toileting - Clothing Manipulation and hygiene: with supervision;sit to/from stand Additional ADL Goal #1: Pt will be Mod I in and OOB for BADLs  OT Frequency: Min 3X/week           Co-evaluation PT/OT/SLP Co-Evaluation/Treatment: Yes Reason for Co-Treatment: Complexity of the patient's impairments (multi-system involvement)   OT goals addressed during session: ADL's and self-care;Strengthening/ROM      End of Session Equipment Utilized During Treatment: Rolling walker Nurse Communication: Mobility status  Activity Tolerance: Patient tolerated treatment well Patient left: in chair;with call bell/phone within reach   Time: 1046-1107 OT Time Calculation (min): 21 min Charges:  OT General Charges $OT Visit: 1 Procedure OT Evaluation $Initial OT Evaluation Tier I: 1 Procedure OT Treatments $Self Care/Home Management : 8-22 mins  Evette GeorgesLeonard, Danicka Hourihan Eva [phone] 06/21/2013, 4:22 PM

## 2013-06-21 NOTE — Progress Notes (Signed)
3mL of reduced dose morphine PCA wasted in sink, witnessed by Devona KonigAvery Daniel, RN. Carlos LeveringMcAtee, Akia Montalban R

## 2013-06-21 NOTE — Progress Notes (Addendum)
[facility]  Subjective  - Pain is her biggest complaint.  Increased PCA to full dose.   Objective 122/74 83 99.9 F (37.7 C) (Core (Comment)) 15 96%  Intake/Output Summary (Last 24 hours) at 06/21/13 0724 Last data filed at 06/21/13 0600  Gross per 24 hour  Intake   5805 ml  Output   2955 ml  Net   2850 ml    Min BS to auscultation Feet warm to touch active range of motion intact equal bil. Abdomin soft tender to touch at incision area Wound vac in place bilateral groin area incisions, no drain output   Assessment/Planning: POD #1 Aortobifemoral bypass graft using a 14 x 7 dacryon graft with reimplantation of the inferior mesenteric artery Foley to gravity urin output 40 cc per hour HGB 10.3 cell saver used during procedure 220 cc given back intraoperative PCA increased to full dose this am NG tube output 120 ml   Kelly [name] 06/21/2013 7:24 AM --  Laboratory Lab Results:  Recent Labs  06/20/13 1627 06/21/13 0340  WBC 10.6* 5.8  HGB 10.3* 10.3*  HCT 30.3* 30.0*  PLT 197 182   BMET  Recent Labs  06/20/13 1627 06/21/13 0340  NA 140 134*  K 3.9 3.7  CL 105 100  CO2 21 21  GLUCOSE 149* 134*  BUN 8 7  CREATININE 0.62 0.61  CALCIUM 8.3* 8.1*    COAG Lab Results  Component Value Date   INR 1.24 06/20/2013   INR 0.92 06/14/2013   INR 1.00 05/24/2013   No results found for this basename: PTT      I agree with the above C/o pain overnight, however her feet did not bother her Incisions ok Doppler signals bilaterally  D/c a-line D/c swann OOB to chair today Will keep NG in place 1 more day, will not replace if pulled out Start toradol for pain, monitor Cr  Kelly [name]

## 2013-06-21 NOTE — Plan of Care (Signed)
Problem: Phase I Progression Outcomes Goal: Patient extubated within - Outcome: Completed/Met Date Met:  06/21/13 Extubated in PACU

## 2013-06-21 NOTE — Evaluation (Addendum)
Physical Therapy Evaluation Patient Details Name: Kelly [name] MRN: [id number] DOB: 11-Feb-1956 Today's Date: 06/21/2013   History of Present Illness  Aortobifemoral bypass graft using a 14 x 7 dacryon graft with reimplantation of the inferior mesenteric artery with Bil groin vacs  Clinical Impression  Pt admitted with/for BPG' ing.  Pt currently limited functionally due to the problems listed below.  (see problems list.)  Pt will benefit from PT to maximize function and safety to be able to get home safely with available assist of family.     Follow Up Recommendations Home health PT;Supervision for mobility/OOB    Equipment Recommendations   (TBA)    Recommendations for Other Services       Precautions / Restrictions Precautions Precautions: Fall Precaution Comments: adominal wound, Bil groin wound vacs Restrictions Weight Bearing Restrictions: No      Mobility  Bed Mobility Overal bed mobility: Needs Assistance;+2 for physical assistance Bed Mobility: Supine to Sit     Supine to sit: Mod assist;+2 for physical assistance     General bed mobility comments: truncal assist due to incisional pain  Transfers Overall transfer level: Needs assistance Equipment used: Rolling walker (2 wheeled) Transfers: Sit to/from Stand Sit to Stand: Mod assist         General transfer comment: lifting and steadying assist  Ambulation/Gait Ambulation/Gait assistance: Min assist Ambulation Distance (Feet): 100 Feet Assistive device: Rolling walker (2 wheeled) Gait Pattern/deviations: Step-through pattern;Decreased step length - right;Decreased step length - left   Gait velocity interpretation: Below normal speed for age/gender General Gait Details: steady, but slow and guarded  Stairs            Wheelchair Mobility    Modified Rankin (Stroke Patients Only)       Balance Overall balance assessment: Needs assistance Sitting-balance support: Bilateral upper  extremity supported;Feet supported Sitting balance-Leahy Scale: Fair     Standing balance support: Bilateral upper extremity supported Standing balance-Leahy Scale: Fair Standing balance comment: stood during line setup without holding                             Pertinent Vitals/Pain 8/10 abdominal pain    Home Living Family/patient expects to be discharged to:: Private residence Living Arrangements: Children Available Help at Discharge: Family;Available PRN/intermittently Type of Home: House Home Access: Stairs to enter Entrance Stairs-Rails: Right Entrance Stairs-Number of Steps: 4 Home Layout: One level Home Equipment: None      Prior Function Level of Independence: Independent               Hand Dominance   Dominant Hand: Right    Extremity/Trunk Assessment   Upper Extremity Assessment: Overall WFL for tasks assessed           Lower Extremity Assessment: Generalized weakness         Communication   Communication: No difficulties  Cognition Arousal/Alertness: Awake/alert Behavior During Therapy: WFL for tasks assessed/performed Overall Cognitive Status: Within Functional Limits for tasks assessed                      General Comments      Exercises        Assessment/Plan    PT Assessment Patient needs continued PT services  PT Diagnosis Acute pain;Difficulty walking   PT Problem List Decreased strength;Decreased range of motion;Decreased balance;Decreased mobility;Decreased knowledge of use of DME;Pain  PT Treatment Interventions DME instruction;Gait training;Stair training;Functional mobility  training;Therapeutic activities;Patient/family education   PT Goals (Current goals can be found in the Care Plan section) Acute Rehab PT Goals Patient Stated Goal: none stated PT Goal Formulation: With patient Time For Goal Achievement: 06/21/13 Potential to Achieve Goals: Good    Frequency Min 3X/week   Barriers to  discharge        Co-evaluation PT/OT/SLP Co-Evaluation/Treatment: Yes Reason for Co-Treatment: Complexity of the patient's impairments (multi-system involvement) PT goals addressed during session: Mobility/safety with mobility OT goals addressed during session: ADL's and self-care;Strengthening/ROM       End of Session Equipment Utilized During Treatment: Oxygen Activity Tolerance: Patient tolerated treatment well Patient left: in chair;with call bell/phone within reach Nurse Communication: Mobility status         Time: [id number] PT Time Calculation (min): 21 min   Charges:   PT Evaluation $Initial PT Evaluation Tier I: 1 Procedure     PT G Codes:          Uniqua Kihn, Eliseo GumKenneth V 06/21/2013, 4:29 PM  06/21/2013  Coulter BingKen Lavaris Sexson, PT [phone]  (pager)

## 2013-06-22 LAB — CBC
HEMATOCRIT: 26.6 % — AB (ref 36.0–46.0)
HEMOGLOBIN: 9 g/dL — AB (ref 12.0–15.0)
MCH: 26.9 pg (ref 26.0–34.0)
MCHC: 33.8 g/dL (ref 30.0–36.0)
MCV: 79.6 fL (ref 78.0–100.0)
Platelets: 158 10*3/uL (ref 150–400)
RBC: 3.34 MIL/uL — ABNORMAL LOW (ref 3.87–5.11)
RDW: 15.1 % (ref 11.5–15.5)
WBC: 7.4 10*3/uL (ref 4.0–10.5)

## 2013-06-22 LAB — BASIC METABOLIC PANEL
BUN: 10 mg/dL (ref 6–23)
CHLORIDE: 101 meq/L (ref 96–112)
CO2: 23 meq/L (ref 19–32)
CREATININE: 0.79 mg/dL (ref 0.50–1.10)
Calcium: 7.7 mg/dL — ABNORMAL LOW (ref 8.4–10.5)
GFR calc Af Amer: >90 mL/min (ref >90)
GFR calc non Af Amer: >90 mL/min (ref >90)
GLUCOSE: 122 mg/dL — AB (ref 70–99)
POTASSIUM: 3.4 meq/L — AB (ref 3.7–5.3)
Sodium: 137 mEq/L (ref 137–147)

## 2013-06-22 MED ORDER — HETASTARCH-ELECTROLYTES 6 % IV SOLN
500.0000 mL | Freq: Once | INTRAVENOUS | Status: AC
  Administered 2013-06-22: 500 mL via INTRAVENOUS
  Filled 2013-06-22: qty 500

## 2013-06-22 MED ORDER — POTASSIUM CHLORIDE 10 MEQ/50ML IV SOLN
10.0000 meq | INTRAVENOUS | Status: AC
  Administered 2013-06-22 (×3): 10 meq via INTRAVENOUS
  Filled 2013-06-22 (×3): qty 50

## 2013-06-22 NOTE — Progress Notes (Signed)
Subjective: Interval History: none.. sitting up in chair comfortable. Reports she is not hungry but thirsty no flatus  Objective: Vital signs in last 24 hours: Temp:  [98.9 F (37.2 C)-100 F (37.8 C)] 99.7 F (37.6 C) (04/04 0711) Pulse Rate:  [81-100] 86 (04/04 0700) Resp:  [12-29] 14 (04/04 0700) BP: (90-153)/(62-81) 117/73 mmHg (04/04 0700) SpO2:  [90 %-100 %] 100 % (04/04 0700) Arterial Line BP: (158)/(73) 158/73 mmHg (04/03 0900) Weight:  [171 lb 3.2 oz (77.656 kg)] 171 lb 3.2 oz (77.656 kg) (04/04 0400)  Intake/Output from previous day: 04/03 0701 - 04/04 0700 In: 3734.9 [I.V.:2984.9; NG/GT:200; IV Piggyback:550] Out: 1865 [Urine:1365; Emesis/NG output:500] Intake/Output this shift:    Chest clear with equal breath sounds bilaterally. Abdomen soft nontender. No bowel sounds noted. Feet well perfused.  Lab Results:  Recent Labs  06/21/13 0340 06/22/13 0355  WBC 5.8 7.4  HGB 10.3* 9.0*  HCT 30.0* 26.6*  PLT 182 158   BMET  Recent Labs  06/21/13 0340 06/22/13 0355  NA 134* 137  K 3.7 3.4*  CL 100 101  CO2 21 23  GLUCOSE 134* 122*  BUN 7 10  CREATININE 0.61 0.79  CALCIUM 8.1* 7.7*    Studies/Results: Dg Chest 2 View  05/24/2013   CLINICAL DATA:  Preop for angio procedure  EXAM: CHEST  2 VIEW  COMPARISON:  10/05/2012  FINDINGS: Cardiomediastinal silhouette is stable. No acute infiltrate or pleural effusion. No pulmonary edema. Bony thorax is unremarkable.  IMPRESSION: No active cardiopulmonary disease.   Electronically Signed   By: Natasha MeadLiviu  Pop M.D.   On: 05/24/2013 14:01   Dg Chest Portable 1 View  06/21/2013   CLINICAL DATA:  Postop  EXAM: PORTABLE CHEST - 1 VIEW  COMPARISON:  DG CHEST 1V PORT dated 06/20/2013; DG CHEST 2 VIEW dated 05/24/2013  FINDINGS: Grossly unchanged cardiac silhouette and mediastinal contours. Stable positioning of support apparatus. No pneumothorax. Lung volumes remain reduced. No change to minimally improved aeration of the lungs with  residual bilateral infrahilar opacities, left greater than right. There is grossly unchanged mild asymmetric cephalization of flow within the left lung. No new focal airspace opacities. No evidence of edema. Unchanged bones.  IMPRESSION: 1.  Stable positioning of support apparatus.  No pneumothorax. 2. Findings most suggestive of improving asymmetric edema and atelectasis.   Electronically Signed   By: Simonne ComeJohn  Watts M.D.   On: 06/21/2013 08:06   Dg Chest Portable 1 View  06/20/2013   CLINICAL DATA:  Line placement  EXAM: PORTABLE CHEST - 1 VIEW  COMPARISON:  DG CHEST 2 VIEW dated 05/24/2013  FINDINGS: Low lung volumes. Cardiac silhouette within upper limits of normal. Swan-Ganz catheter is appreciated via a left internal jugular approach tip projecting in the central mediastinum. This appears to be in the region of the proximal pulmonary artery. An NG tube is seen portion curled in the region of the stomach. No focal regions of consolidation are appreciated. There is mild prominence of the interstitial markings and peribronchial cuffing. Diffuse ground-glass density in the left hemi thorax. Osseous structures are unremarkable.  IMPRESSION: 1. Support lines and tubes which appear appropriately positioned. 2. Interstitial infiltrate, mild, likely reflecting pulmonary edema with asymmetric component in the left hemi thorax.   Electronically Signed   By: Salome HolmesHector  Cooper M.D.   On: 06/20/2013 16:52   Dg Abd Portable 1v  06/20/2013   CLINICAL DATA:  Post  op  EXAM: PORTABLE ABDOMEN - 1 VIEW  COMPARISON:  None.  FINDINGS: Air is seen within nondilated loops of small bowel. A moderate to large amount of stool is appreciated. The distal portion of a NG tube is appreciated curled in the fundal region of the stomach. Degenerative changes are appreciated within the lower lumbar spine.  IMPRESSION: Nonobstructive bowel gas pattern with a moderate to large amount of stool. The distal portion of the patient's NG tube appears  adequately positioned.   Electronically Signed   By: Salome Holmes M.D.   On: 06/20/2013 16:54   Anti-infectives: Anti-infectives   Start     Dose/Rate Route Frequency Ordered Stop   06/20/13 2200  cefUROXime (ZINACEF) 1.5 g in dextrose 5 % 50 mL IVPB     1.5 g 100 mL/hr over 30 Minutes Intravenous Every 12 hours 06/20/13 1818 06/21/13 1007   06/20/13 1315  cefUROXime (ZINACEF) 1.5 g in dextrose 5 % 50 mL IVPB  Status:  Discontinued     1.5 g 100 mL/hr over 30 Minutes Intravenous  Once 06/20/13 1308 06/20/13 1803   06/19/13 1408  cefUROXime (ZINACEF) 1.5 g in dextrose 5 % 50 mL IVPB     1.5 g 100 mL/hr over 30 Minutes Intravenous 30 min pre-op 06/19/13 1408 06/20/13 1313      Assessment/Plan: s/p Procedure(s): AORTA BIFEMORAL BYPASS GRAFT WITH REIMPLANTATION OF THE INFERIOR MESENTERIC ARTERY (N/A) Stable overall. NG tube is only had 100 cc over the last 12 hours. Will DC NG tube and keep n.p.o. Main concern continues to be urine output. Her creatinine has remained stable. She had minimal response to 2 500 cc boluses. She did have some output with 20 mg of Lasix. We'll continue to push volume. Does not appear to be volume overloaded. Keep and surgical intensive care unit due to output issues   LOS: 2 days   EARLY, TODD 06/22/2013, 8:32 AM

## 2013-06-22 NOTE — Progress Notes (Signed)
MD Early paged about low UOP over last 2 hours. New orders received.

## 2013-06-23 ENCOUNTER — Encounter (HOSPITAL_COMMUNITY): Payer: Self-pay | Admitting: Anesthesiology

## 2013-06-23 LAB — CBC
HCT: 22.9 % — ABNORMAL LOW (ref 36.0–46.0)
HEMOGLOBIN: 7.8 g/dL — AB (ref 12.0–15.0)
MCH: 27 pg (ref 26.0–34.0)
MCHC: 34.1 g/dL (ref 30.0–36.0)
MCV: 79.2 fL (ref 78.0–100.0)
Platelets: 149 10*3/uL — ABNORMAL LOW (ref 150–400)
RBC: 2.89 MIL/uL — ABNORMAL LOW (ref 3.87–5.11)
RDW: 15 % (ref 11.5–15.5)
WBC: 6.2 10*3/uL (ref 4.0–10.5)

## 2013-06-23 LAB — BASIC METABOLIC PANEL
BUN: 8 mg/dL (ref 6–23)
CO2: 22 mEq/L (ref 19–32)
CREATININE: 0.69 mg/dL (ref 0.50–1.10)
Calcium: 7.6 mg/dL — ABNORMAL LOW (ref 8.4–10.5)
Chloride: 103 mEq/L (ref 96–112)
Glucose, Bld: 115 mg/dL — ABNORMAL HIGH (ref 70–99)
Potassium: 3.3 mEq/L — ABNORMAL LOW (ref 3.7–5.3)
Sodium: 136 mEq/L — ABNORMAL LOW (ref 137–147)

## 2013-06-23 LAB — PREPARE RBC (CROSSMATCH)

## 2013-06-23 MED ORDER — BISACODYL 10 MG RE SUPP
10.0000 mg | Freq: Once | RECTAL | Status: AC
  Administered 2013-06-23: 10 mg via RECTAL
  Filled 2013-06-23: qty 1

## 2013-06-23 MED ORDER — POTASSIUM CHLORIDE 10 MEQ/50ML IV SOLN
10.0000 meq | INTRAVENOUS | Status: AC
  Administered 2013-06-23 (×3): 10 meq via INTRAVENOUS
  Filled 2013-06-23 (×3): qty 50

## 2013-06-23 NOTE — Progress Notes (Signed)
Patient transferred form 2S16 to 2W07 via ambulation with wheelchair assist.  At time of transfer patient is alert, oriented and VSS.  All patient belongings, patient's chart and meds transferred with patient.  Patient left in the care of the unit nurse tech.  Tommi EmeryHINTZ, Keleigh Kazee M

## 2013-06-23 NOTE — Progress Notes (Signed)
Subjective: Interval History: none.Marland Kitchen up in chair comfortable  Objective: Vital signs in last 24 hours: Temp:  [98 F (36.7 C)-99.1 F (37.3 C)] 98.3 F (36.8 C) (04/05 0702) Pulse Rate:  [67-121] 80 (04/05 0700) Resp:  [14-24] 20 (04/05 0700) BP: (107-133)/(70-83) 117/75 mmHg (04/05 0700) SpO2:  [95 %-100 %] 100 % (04/05 0700) Weight:  [170 lb 9.6 oz (77.384 kg)] 170 lb 9.6 oz (77.384 kg) (04/05 0300)  Intake/Output from previous day: 04/04 0701 - 04/05 0700 In: 3966.3 [I.V.:3816.3; IV Piggyback:150] Out: 1475 [Urine:1475] Intake/Output this shift:    Abdomen soft nontender dressing intact. Extremities well perfused. Palpable right popliteal pulse. VAC dressings intact  Lab Results:  Recent Labs  06/22/13 0355 06/23/13 0405  WBC 7.4 6.2  HGB 9.0* 7.8*  HCT 26.6* 22.9*  PLT 158 149*   BMET  Recent Labs  06/22/13 0355 06/23/13 0405  NA 137 136*  K 3.4* 3.3*  CL 101 103  CO2 23 22  GLUCOSE 122* 115*  BUN 10 8  CREATININE 0.79 0.69  CALCIUM 7.7* 7.6*    Studies/Results: Dg Chest 2 View  05/24/2013   CLINICAL DATA:  Preop for angio procedure  EXAM: CHEST  2 VIEW  COMPARISON:  10/05/2012  FINDINGS: Cardiomediastinal silhouette is stable. No acute infiltrate or pleural effusion. No pulmonary edema. Bony thorax is unremarkable.  IMPRESSION: No active cardiopulmonary disease.   Electronically Signed   By: Natasha Mead M.D.   On: 05/24/2013 14:01   Dg Chest Portable 1 View  06/21/2013   CLINICAL DATA:  Postop  EXAM: PORTABLE CHEST - 1 VIEW  COMPARISON:  DG CHEST 1V PORT dated 06/20/2013; DG CHEST 2 VIEW dated 05/24/2013  FINDINGS: Grossly unchanged cardiac silhouette and mediastinal contours. Stable positioning of support apparatus. No pneumothorax. Lung volumes remain reduced. No change to minimally improved aeration of the lungs with residual bilateral infrahilar opacities, left greater than right. There is grossly unchanged mild asymmetric cephalization of flow within the  left lung. No new focal airspace opacities. No evidence of edema. Unchanged bones.  IMPRESSION: 1.  Stable positioning of support apparatus.  No pneumothorax. 2. Findings most suggestive of improving asymmetric edema and atelectasis.   Electronically Signed   By: Simonne Come M.D.   On: 06/21/2013 08:06   Dg Chest Portable 1 View  06/20/2013   CLINICAL DATA:  Line placement  EXAM: PORTABLE CHEST - 1 VIEW  COMPARISON:  DG CHEST 2 VIEW dated 05/24/2013  FINDINGS: Low lung volumes. Cardiac silhouette within upper limits of normal. Swan-Ganz catheter is appreciated via a left internal jugular approach tip projecting in the central mediastinum. This appears to be in the region of the proximal pulmonary artery. An NG tube is seen portion curled in the region of the stomach. No focal regions of consolidation are appreciated. There is mild prominence of the interstitial markings and peribronchial cuffing. Diffuse ground-glass density in the left hemi thorax. Osseous structures are unremarkable.  IMPRESSION: 1. Support lines and tubes which appear appropriately positioned. 2. Interstitial infiltrate, mild, likely reflecting pulmonary edema with asymmetric component in the left hemi thorax.   Electronically Signed   By: Salome Holmes M.D.   On: 06/20/2013 16:52   Dg Abd Portable 1v  06/20/2013   CLINICAL DATA:  Post  op  EXAM: PORTABLE ABDOMEN - 1 VIEW  COMPARISON:  None.  FINDINGS: Air is seen within nondilated loops of small bowel. A moderate to large amount of stool is appreciated. The distal portion  of a NG tube is appreciated curled in the fundal region of the stomach. Degenerative changes are appreciated within the lower lumbar spine.  IMPRESSION: Nonobstructive bowel gas pattern with a moderate to large amount of stool. The distal portion of the patient's NG tube appears adequately positioned.   Electronically Signed   By: Salome HolmesHector  Cooper M.D.   On: 06/20/2013 16:54   Anti-infectives: Anti-infectives   Start      Dose/Rate Route Frequency Ordered Stop   06/20/13 2200  cefUROXime (ZINACEF) 1.5 g in dextrose 5 % 50 mL IVPB     1.5 g 100 mL/hr over 30 Minutes Intravenous Every 12 hours 06/20/13 1818 06/21/13 1007   06/20/13 1315  cefUROXime (ZINACEF) 1.5 g in dextrose 5 % 50 mL IVPB  Status:  Discontinued     1.5 g 100 mL/hr over 30 Minutes Intravenous  Once 06/20/13 1308 06/20/13 1803   06/19/13 1408  cefUROXime (ZINACEF) 1.5 g in dextrose 5 % 50 mL IVPB     1.5 g 100 mL/hr over 30 Minutes Intravenous 30 min pre-op 06/19/13 1408 06/20/13 1313      Assessment/Plan: s/p Procedure(s): AORTA BIFEMORAL BYPASS GRAFT WITH REIMPLANTATION OF THE INFERIOR MESENTERIC ARTERY (N/A) Stable overall. Better urine output. Acute blood loss anemia and also dilution from hydration. Hematocrit 22. Discussed with the patient. Will transfuse 2 units packed cells today. Okay to transfer to unit 2000/2 W. begin clear liquids.   LOS: 3 days   Kelly [name] 06/23/2013, 7:39 AM

## 2013-06-24 ENCOUNTER — Encounter (HOSPITAL_COMMUNITY): Payer: Self-pay | Admitting: Surgery

## 2013-06-24 ENCOUNTER — Encounter: Payer: Managed Care, Other (non HMO) | Admitting: Surgery

## 2013-06-24 LAB — TYPE AND SCREEN
ABO/RH(D): AB POS
Antibody Screen: NEGATIVE
Unit division: 0
Unit division: 0

## 2013-06-24 LAB — CBC
HEMATOCRIT: 28.3 % — AB (ref 36.0–46.0)
HEMOGLOBIN: 9.9 g/dL — AB (ref 12.0–15.0)
MCH: 28.2 pg (ref 26.0–34.0)
MCHC: 35 g/dL (ref 30.0–36.0)
MCV: 80.6 fL (ref 78.0–100.0)
Platelets: 195 10*3/uL (ref 150–400)
RBC: 3.51 MIL/uL — ABNORMAL LOW (ref 3.87–5.11)
RDW: 15 % (ref 11.5–15.5)
WBC: 6.2 10*3/uL (ref 4.0–10.5)

## 2013-06-24 MED ORDER — OXYCODONE HCL 5 MG PO TABS
5.0000 mg | ORAL_TABLET | Freq: Four times a day (QID) | ORAL | Status: DC | PRN
  Administered 2013-06-24 – 2013-06-26 (×7): 5 mg via ORAL
  Filled 2013-06-24 (×7): qty 1

## 2013-06-24 NOTE — Progress Notes (Signed)
[facility] Progress Note  06/24/2013 7:29 AM 4 Days Post-Op  Subjective:  She states she is feeling tied down.  Wants to get rid of the pain pump.  Afebrile VSS 98% RA  Filed Vitals:   06/24/13 0527  BP: 124/84  Pulse: 71  Temp: 98 F (36.7 C)  Resp: 16    Physical Exam: Cardiac:  regular Lungs:  CTAB Abdomen:  Soft, NT/ND +BS, +flatus Incisions:  Midline incision is healing nicely; wound vacs to bilateral groins Extremities:  + doppler signals bilateral DP/PT  CBC    Component Value Date/Time   WBC 6.2 06/23/2013 0405   RBC 2.89* 06/23/2013 0405   HGB 7.8* 06/23/2013 0405   HCT 22.9* 06/23/2013 0405   PLT 149* 06/23/2013 0405   MCV 79.2 06/23/2013 0405   MCH 27.0 06/23/2013 0405   MCHC 34.1 06/23/2013 0405   RDW 15.0 06/23/2013 0405   LYMPHSABS 3.6 10/04/2012 2335   MONOABS 0.5 10/04/2012 2335   EOSABS 0.1 10/04/2012 2335   BASOSABS 0.0 10/04/2012 2335    BMET    Component Value Date/Time   NA 136* 06/23/2013 0405   K 3.3* 06/23/2013 0405   CL 103 06/23/2013 0405   CO2 22 06/23/2013 0405   GLUCOSE 115* 06/23/2013 0405   BUN 8 06/23/2013 0405   CREATININE 0.69 06/23/2013 0405   CREATININE 0.75 05/24/2013 1547   CALCIUM 7.6* 06/23/2013 0405   GFRNONAA >90 06/23/2013 0405   GFRAA >90 06/23/2013 0405    INR    Component Value Date/Time   INR 1.24 06/20/2013 1627     Intake/Output Summary (Last 24 hours) at 06/24/13 0729 Last data filed at 06/24/13 0600  Gross per 24 hour  Intake 4279.5 ml  Output   1895 ml  Net 2384.5 ml     Assessment/Plan:  58 y.o. female is s/p  Aortobifemoral bypass graft using a 14 x 7 dacryon graft with reimplantation of the inferior mesenteric artery 4 Days Post-Op  -acute surgical blood loss anemia-received 2 units PRBC's yesterday.  No new labs today.  Will check in the am. -advance diet to full liquids -probably d/c foley today (will d/w Dr. Estrella DeedsBrabham)-was left in to monitor UOP-appears improved today -continue to increase  mobilization -probably remove wound vacs from groins today (will d/w Dr. Myra GianottiBrabham).   Doreatha MassedSamantha Rhyne, PA-C [facility] [phone] 06/24/2013 7:29 AM    Agree with the above Keep wound vac in place D/c foley   [place] Enzo Treu

## 2013-06-24 NOTE — Care Management Note (Signed)
    Page 1 of 2   06/26/2013     1:58:00 PM   CARE MANAGEMENT NOTE 06/26/2013  Patient:  Kelly [name], Kelly [name]   Account Number:  [id number]  Date Initiated:  06/24/2013  Documentation initiated by:  Rajan Burgard  Subjective/Objective Assessment:   PT S/P AORTOBIFEMORAL BYPASS ON 06/20/13.  PTA, PT RESIDES AT [place].     Action/Plan:   WILL FOLLOW FOR DC NEEDS AS PT PROGRESSES.   Anticipated DC Date:  06/26/2013   Anticipated DC Plan:  [place]  CM consult      Choice offered to / List presented to:     DME arranged  [place]      DME agency  [place]        Status of service:  Completed, signed off Medicare Important Message given?   (If response is "NO", the following Medicare IM given date fields will be blank) Date Medicare IM given:   Date Additional Medicare IM given:    Discharge Disposition:  HOME/SELF CARE  Per UR Regulation:  Reviewed for med. necessity/level of care/duration of stay  If discussed at [place] Length of Stay Meetings, dates discussed:   06/25/2013    Comments:  06/26/13 Kaelyn Nauta,RN,BSN [phone] 3 IN 1 BSC DELIVERED TO PT'S ROOM PRIOR TO DC BY [facility].  06/25/13 Samnang Shugars,RN,BSN [phone] MET WITH PT AND FAMILY TO DISCUSS DC NEEDS; PT REQUESTS 3 IN 1 FOR HOME USE.  PT HAS NO PCP.Marland KitchenMarland KitchenPT GIVEN PHONE # FOR [facility].  SHE STATES SHE WILL CALL ASAP FOR ASSISTANCE WITH OBTAINING PCP. REFERRAL TO [facility] FOR NEEDED DME, PER INSURANCE CONTRACT. PT STATES SHE IS AMBULATING WELL, AND DOES NOT NEED RW.

## 2013-06-24 NOTE — Progress Notes (Signed)
Physical Therapy Treatment Patient Details Name: Kelly HewBrenda S Gillooly MRN: [id number] DOB: 08-26-1955 Today's Date: 06/24/2013    History of Present Illness Aortobifemoral bypass graft using a 14 x 7 dacryon graft with reimplantation of the inferior mesenteric artery with Bil groin vacs    PT Comments    Progressing as expected.  Will update goals to Modified independence.  Follow Up Recommendations  Home health PT;Supervision for mobility/OOB     Equipment Recommendations  None recommended by PT    Recommendations for Other Services       Precautions / Restrictions Precautions Precautions: Fall Precaution Comments: adominal wound, Bil groin wound vacs    Mobility  Bed Mobility Overal bed mobility: Needs Assistance;+2 for physical assistance Bed Mobility: Sidelying to Sit   Sidelying to sit: Min guard;HOB elevated (with rail)       General bed mobility comments: discussion of rolling to get oob  Transfers Overall transfer level: Needs assistance   Transfers: Sit to/from Stand Sit to Stand: Min guard         General transfer comment: cues for technique.  Ambulation/Gait Ambulation/Gait assistance: Min guard Ambulation Distance (Feet): 500 Feet Assistive device: None (pushing iv pole) Gait Pattern/deviations: Step-through pattern Gait velocity: able to increase speed to an age appropriate speed Gait velocity interpretation: at or above normal speed for age/gender     Stairs Stairs: Yes Stairs assistance: Min guard Stair Management: One rail Right;Alternating pattern;Forwards Number of Stairs: 3 General stair comments: steady  Wheelchair Mobility    Modified Rankin (Stroke Patients Only)       Balance Overall balance assessment: No apparent balance deficits (not formally assessed)   Sitting balance-Leahy Scale: Normal       Standing balance-Leahy Scale: Normal                      Cognition Arousal/Alertness: Awake/alert Behavior  During Therapy: WFL for tasks assessed/performed Overall Cognitive Status: Within Functional Limits for tasks assessed                      Exercises      General Comments        Pertinent Vitals/Pain     Home Living                      Prior Function            PT Goals (current goals can now be found in the care plan section) Acute Rehab PT Goals Patient Stated Goal: go home PT Goal Formulation: With patient Time For Goal Achievement: 07/03/13 Potential to Achieve Goals: Good Progress towards PT goals: Progressing toward goals    Frequency  Min 3X/week    PT Plan Current plan remains appropriate    Co-evaluation             End of Session   Activity Tolerance: Patient tolerated treatment well Patient left: in chair;with call bell/phone within reach     Time: [id number] PT Time Calculation (min): 18 min  Charges:  $Gait Training: 8-22 mins                    G Codes:      Duanne Duchesne, Eliseo GumKenneth V 06/24/2013, 4:33 PM 06/24/2013  [place] BingKen Emmalia Heyboer, PT [phone]  (pager)

## 2013-06-24 NOTE — Progress Notes (Addendum)
Occupational Therapy Treatment Patient Details Name: Kelly [name] MRN: [id number] DOB: 1955/10/29 Today's Date: 06/24/2013    History of present illness Aortobifemoral bypass graft using a 14 x 7 dacryon graft with reimplantation of the inferior mesenteric artery with Bil groin vacs   OT comments  Pt progressing towards goals. Performed ADLs during session.   Follow Up Recommendations  Home health OT    Equipment Recommendations  3 in 1 bedside comode    Recommendations for Other Services      Precautions / Restrictions Precautions Precautions: Fall Precaution Comments: adominal wound, Bil groin wound vacs Restrictions Weight Bearing Restrictions: No       Mobility Bed Mobility                  Transfers Overall transfer level: Needs assistance Equipment used: Rolling walker (2 wheeled) Transfers: Sit to/from Stand Sit to Stand: Min guard         General transfer comment: cues for technique.    Balance                                   ADL Overall ADL's : Needs assistance/impaired     Grooming: Applying deodorant;Oral care;Standing;Supervision/safety   Upper Body Bathing: Supervision/ safety;Standing   Lower Body Bathing: Min guard (standing and sitting)       Lower Body Dressing: Set up;Supervision/safety;Sitting/lateral leans (socks)   Toilet Transfer: Min guard;Ambulation;Regular Toilet;BSC           Functional mobility during ADLs: Min guard;Rolling walker General ADL Comments: Educated pt on use of bag on walker to carry items. Pt performed grooming/bathing at sink. Pt practiced toilet transfer and education on 3 in 1.  Educated on rolling for bed mobility and explained this may be easier for her. OT assisted in donning gown.      Vision                     Perception     Praxis      Cognition   Behavior During Therapy: [facility] for tasks assessed/performed Overall Cognitive Status: Within Functional  Limits for tasks assessed                       Extremity/Trunk Assessment               Exercises     Shoulder Instructions       General Comments      Pertinent Vitals/ Pain       No pain reported.   Home Living                                          Prior Functioning/Environment              Frequency Min 3X/week     Progress Toward Goals  OT Goals(current goals can now be found in the care plan section)  Progress towards OT goals: Progressing toward goals  Acute Rehab OT Goals Patient Stated Goal: go home OT Goal Formulation: With patient Time For Goal Achievement: 07/05/13 Potential to Achieve Goals: Good ADL Goals Pt Will Perform Grooming: with set-up;with supervision;standing Pt Will Perform Lower Body Bathing: with set-up;with supervision;sit to/from stand;with adaptive equipment Pt Will Perform Lower Body Dressing: with set-up;with supervision;sit to/from stand;with  adaptive equipment Pt Will Transfer to Toilet: with supervision;ambulating;bedside commode (over toilet) Pt Will Perform Toileting - Clothing Manipulation and hygiene: with supervision;sit to/from stand Additional ADL Goal #1: Pt will be Mod I in and OOB for BADLs  Plan Discharge plan remains appropriate    Co-evaluation                 End of Session Equipment Utilized During Treatment: Gait belt;Rolling walker   Activity Tolerance Patient tolerated treatment well   Patient Left in chair;with call bell/phone within reach   Nurse Communication          Time: 1610-9604 OT Time Calculation (min): 31 min  Charges: OT General Charges $OT Visit: 1 Procedure OT Treatments $Self Care/Home Management : 23-37 mins  Earlie Raveling OTR/L [phone] 06/24/2013, 10:55 AM

## 2013-06-25 LAB — BASIC METABOLIC PANEL
BUN: 5 mg/dL — ABNORMAL LOW (ref 6–23)
CHLORIDE: 104 meq/L (ref 96–112)
CO2: 21 mEq/L (ref 19–32)
Calcium: 8.4 mg/dL (ref 8.4–10.5)
Creatinine, Ser: 0.68 mg/dL (ref 0.50–1.10)
GFR calc Af Amer: >90 mL/min (ref >90)
GFR calc non Af Amer: >90 mL/min (ref >90)
Glucose, Bld: 107 mg/dL — ABNORMAL HIGH (ref 70–99)
POTASSIUM: 3.8 meq/L (ref 3.7–5.3)
Sodium: 139 mEq/L (ref 137–147)

## 2013-06-25 LAB — CBC
HCT: 28.8 % — ABNORMAL LOW (ref 36.0–46.0)
Hemoglobin: 10 g/dL — ABNORMAL LOW (ref 12.0–15.0)
MCH: 27.9 pg (ref 26.0–34.0)
MCHC: 34.7 g/dL (ref 30.0–36.0)
MCV: 80.4 fL (ref 78.0–100.0)
PLATELETS: 218 10*3/uL (ref 150–400)
RBC: 3.58 MIL/uL — AB (ref 3.87–5.11)
RDW: 15.2 % (ref 11.5–15.5)
WBC: 6.6 10*3/uL (ref 4.0–10.5)

## 2013-06-25 NOTE — Progress Notes (Signed)
Pt c/o pain, PRN oxycodone @ 405 am as ordered

## 2013-06-25 NOTE — Progress Notes (Signed)
Physical Therapy Treatment Patient Details Name: Glenetta HewBrenda S Golightly MRN: [id number] DOB: Mar 16, 1956 Today's Date: 06/25/2013    History of Present Illness Continues to progress.  Pt hopeful to go home soon    PT Comments    Pt is not feeling quite as well today, but is still progressing with gait.  She continues to have some dyspnea on exertion with decreased activity tolerance.  Follow Up Recommendations        Equipment Recommendations       Recommendations for Other Services       Precautions / Restrictions Precautions Precautions: Fall Precaution Comments: adominal wound, Bil groin wound vacs Restrictions Weight Bearing Restrictions: No    Mobility  Bed Mobility               General bed mobility comments: pt sitting up in chair  Transfers Overall transfer level: Needs assistance Equipment used: Rolling walker (2 wheeled) Transfers: Sit to/from Stand Sit to Stand: Min guard         General transfer comment: pt appears uncomfortable with transitional movements due to pain  Ambulation/Gait Ambulation/Gait assistance: Min guard Ambulation Distance (Feet): 400 Feet (one standing rest break) Assistive device: None (occasionally pushes IV pole, but encouraged to stand up straight and walk without it) Gait Pattern/deviations: Decreased step length - left;Decreased step length - right;Step-through pattern Gait velocity: decreased due to pain and some dyspnea   General Gait Details: steady, but slow and guarded   Stairs            Wheelchair Mobility    Modified Rankin (Stroke Patients Only)       Balance Overall balance assessment: No apparent balance deficits (not formally assessed)   Sitting balance-Leahy Scale: Normal       Standing balance-Leahy Scale: Normal                      Cognition Arousal/Alertness: Awake/alert Behavior During Therapy: WFL for tasks assessed/performed Overall Cognitive Status: Within Functional  Limits for tasks assessed                      Exercises Other Exercises Other Exercises: pt encouraged to do frequent bouts of activity including standing with glute sets and single arm raises to activate core    General Comments        Pertinent Vitals/Pain O2 sats 93% after walking on room air. Dyspnea on exertion    Home Living                      Prior Function            PT Goals (current goals can now be found in the care plan section) Progress towards PT goals: Progressing toward goals    Frequency       PT Plan Current plan remains appropriate    Co-evaluation             End of Session   Activity Tolerance: Patient limited by pain;Patient limited by fatigue Patient left: in chair;with family/visitor present;with call bell/phone within reach     Time: 1115-1135 PT Time Calculation (min): 20 min  Charges:  $Gait Training: 8-22 mins                    G Codes:      Donnetta HailBrown, Kayah Hecker Krall 06/25/2013, 11:39 AM

## 2013-06-25 NOTE — Addendum Note (Signed)
Addendum created 06/25/13 0934 by Kipp Broodavid Stefan Karen, MD   Modules edited: Clinical Notes   Clinical Notes:  File: [id number]; Pend: [id number]; Pend: [id number]

## 2013-06-25 NOTE — Progress Notes (Signed)
Pt a/o, no c/o pain, pt on IVF D51/2 NS @ 100 ml/hr, pt has not had any drainage from wound vac, pt stable

## 2013-06-25 NOTE — Progress Notes (Signed)
Anesthesiology Follow-up:  58 year old AA female underwent aortobifemoral bypass graft using a 14 x 7 dacryon graft with reimplantation of the inferior mesenteric artery on 06/20/13 by Dr. Myra GianottiBrabham. As noted pre-operatively, she has poor dentition with multiple loose death and extensive gingivitis and periodontitis. During laryngoscopy for intubation on 06/20/13, a R.lower tooth was dislodged and saved. The patient was informed of this and she has expressed a desire to have her remaining teeth removed. Will discuss with Dr. Myra GianottiBrabham, Re having Dr. Kristin BruinsKulinski (dental medicine) see Kelly [name] prior to discharge from the hospital.  Kelly Broodavid Evrett Hakim, MD

## 2013-06-25 NOTE — Progress Notes (Signed)
    Subjective  - POD #5  Feels better tonight. Had a difficult night last night.  Didn't get much rest Ambulating Passing flatus Tolerating liquids Pain controlled   Physical Exam:  Abdomen soft Extremities warm Incisions OK.  Vac in place       Assessment/Plan:  POD #5  Advance to regular diet D/c vac tomorrow D/C tomorrow  Myra GianottiBRABHAM IV, Lala LundV. WELLS 06/25/2013 9:05 PM --  Filed Vitals:   06/25/13 2002  BP: 125/73  Pulse: 74  Temp: 99.2 F (37.3 C)  Resp: 18    Intake/Output Summary (Last 24 hours) at 06/25/13 2105 Last data filed at 06/25/13 1630  Gross per 24 hour  Intake    960 ml  Output   1650 ml  Net   -690 ml     Laboratory CBC    Component Value Date/Time   WBC 6.6 06/25/2013 0545   HGB 10.0* 06/25/2013 0545   HCT 28.8* 06/25/2013 0545   PLT 218 06/25/2013 0545    BMET    Component Value Date/Time   NA 139 06/25/2013 0545   K 3.8 06/25/2013 0545   CL 104 06/25/2013 0545   CO2 21 06/25/2013 0545   GLUCOSE 107* 06/25/2013 0545   BUN 5* 06/25/2013 0545   CREATININE 0.68 06/25/2013 0545   CREATININE 0.75 05/24/2013 1547   CALCIUM 8.4 06/25/2013 0545   GFRNONAA >90 06/25/2013 0545   GFRAA >90 06/25/2013 0545    COAG Lab Results  Component Value Date   INR 1.24 06/20/2013   INR 0.92 06/14/2013   INR 1.00 05/24/2013   No results found for this basename: PTT    Antibiotics Anti-infectives   Start     Dose/Rate Route Frequency Ordered Stop   06/20/13 2200  cefUROXime (ZINACEF) 1.5 g in dextrose 5 % 50 mL IVPB     1.5 g 100 mL/hr over 30 Minutes Intravenous Every 12 hours 06/20/13 1818 06/21/13 1007   06/20/13 1315  cefUROXime (ZINACEF) 1.5 g in dextrose 5 % 50 mL IVPB  Status:  Discontinued     1.5 g 100 mL/hr over 30 Minutes Intravenous  Once 06/20/13 1308 06/20/13 1803   06/19/13 1408  cefUROXime (ZINACEF) 1.5 g in dextrose 5 % 50 mL IVPB     1.5 g 100 mL/hr over 30 Minutes Intravenous 30 min pre-op 06/19/13 1408 06/20/13 1313       V. Charlena CrossWells Madeline Bebout IV,  M.D. [facility] Office: [phone] Pager:  [phone]

## 2013-06-26 ENCOUNTER — Inpatient Hospital Stay (HOSPITAL_COMMUNITY): Payer: Managed Care, Other (non HMO)

## 2013-06-26 MED ORDER — OXYCODONE HCL 5 MG PO TABS: 5.0000 mg | ORAL_TABLET | Freq: Four times a day (QID) | ORAL | Status: DC | PRN

## 2013-06-26 NOTE — Progress Notes (Signed)
Physical Therapy Treatment Patient Details Name: Kelly HewBrenda S Swicegood MRN: [id number] DOB: June 16, 1955 Today's Date: 06/26/2013    History of Present Illness Aortobifemoral bypass graft using a 14 x 7 dacryon graft with reimplantation of the inferior mesenteric artery with Bil groin vacs    PT Comments    Progressing well.  Education completed.  Ready for d/c from mobility standpoint.  She will have assist at home.  Follow Up Recommendations  Home health PT;Supervision for mobility/OOB     Equipment Recommendations  None recommended by PT    Recommendations for Other Services       Precautions / Restrictions Precautions Precautions: Fall Precaution Comments: adominal wound, Bil groin wound vacs    Mobility  Bed Mobility                  Transfers Overall transfer level: Needs assistance   Transfers: Sit to/from Stand Sit to Stand: Supervision            Ambulation/Gait Ambulation/Gait assistance: Supervision Ambulation Distance (Feet): 350 Feet Assistive device: None Gait Pattern/deviations: Step-through pattern Gait velocity: decreased due to pain and some dyspnea Gait velocity interpretation: Below normal speed for age/gender General Gait Details: steady and generally slow, but can increase speed to command.  Discussed keeping speed variable and progressing distance readily.   Stairs Stairs: Yes Stairs assistance: Supervision Stair Management: One rail Right;Alternating pattern;Forwards Number of Stairs: 5 General stair comments: steady  Wheelchair Mobility    Modified Rankin (Stroke Patients Only)       Balance Overall balance assessment: No apparent balance deficits (not formally assessed)                                  Cognition Arousal/Alertness: Awake/alert Behavior During Therapy: WFL for tasks assessed/performed Overall Cognitive Status: Within Functional Limits for tasks assessed                       Exercises      General Comments        Pertinent Vitals/Pain     Home Living                      Prior Function            PT Goals (current goals can now be found in the care plan section) Acute Rehab PT Goals Patient Stated Goal: go home PT Goal Formulation: With patient Time For Goal Achievement: 07/03/13 Potential to Achieve Goals: Good Progress towards PT goals: Progressing toward goals    Frequency  Min 3X/week    PT Plan Current plan remains appropriate    Co-evaluation             End of Session   Activity Tolerance: Patient tolerated treatment well Patient left: in chair;with family/visitor present;with call bell/phone within reach     Time: 1000-1023 PT Time Calculation (min): 23 min  Charges:  $Gait Training: 8-22 mins $Therapeutic Activity: 8-22 mins                    G CodesEliseo Gum:      Travone Georg V [place] Serpe 06/26/2013, 10:33 AM 06/26/2013  Orleans BingKen Shavon Ashmore, PT [phone]  (pager)

## 2013-06-26 NOTE — Progress Notes (Signed)
Wound VAC removed per PA order. Scant amount of blood oozing from incision site. Taped dry sterile gauze over site.

## 2013-06-26 NOTE — Discharge Summary (Signed)
[facility] Discharge Summary  Kelly [name] 10/26/1955 58 y.o. female  [id number]  Admission Date: 06/20/2013  Discharge Date: 06/26/13  Physician: Nada LibmanVance W Brabham, MD  Admission Diagnosis: Aortic Occlusion   HPI:   This is a 58 y.o. female that was referred for aortic occlusion. The patient had a CT angiogram in February which shows an infrarenal aortic occlusion with reconstitution of the femoral vessels and no significant outflow a runoff disease. She has also undergone angiography which confirms the above findings. The patient states that she can not walk very far before her legs give out. She also has rest pain in her right leg. She continues to smoke. She has been told she has hypertension, but for financial reasons has not been taking her antihypertensive medication.  Hospital Course:  The patient was admitted to the hospital and taken to the operating room on 06/20/2013 and underwent: Aortobifemoral bypass graft using a 14 x 7 dacryon graft with reimplantation of the inferior mesenteric artery   The pt tolerated the procedure well and was transported to the PACU in good condition. The pt was extubated in the PACU.  Her pain was not well controlled by POD 1 and she was started on a PCA as well as toradol.  The NGT was left for another day.  By POD 2, she was left in the ICU.  Her NGT was discontinued.  Her creatinine has remained stable, but she did have low UOP.  By POD 3, her UOP was improving.  She did have acute surgical blood loss anemia and was transfused 2 units of PRBC's.  She was transferred to 2W and started on clear liquids.  Her PCA was discontinued on POD 4.  She did have incisional wound vacs in place in the groins and they are removed before discharge.  Pt's diet advanced and she has tolerated well.  She did have a right lower tooth dislodged during laryngoscopy for intubation.  The pt was informed of this and expressed a desire to have her  remaining teeth removed.  Will have the dental service either see her before discharge or a follow up appt after discharge.  She will have a panorex xray before discharge and f/u with Dr. Kristin BruinsKulinski in 1-2 weeks.  The remainder of the hospital course consisted of increasing mobilization and increasing intake of solids without difficulty.  CBC    Component Value Date/Time   WBC 6.6 06/25/2013 0545   RBC 3.58* 06/25/2013 0545   HGB 10.0* 06/25/2013 0545   HCT 28.8* 06/25/2013 0545   PLT 218 06/25/2013 0545   MCV 80.4 06/25/2013 0545   MCH 27.9 06/25/2013 0545   MCHC 34.7 06/25/2013 0545   RDW 15.2 06/25/2013 0545   LYMPHSABS 3.6 10/04/2012 2335   MONOABS 0.5 10/04/2012 2335   EOSABS 0.1 10/04/2012 2335   BASOSABS 0.0 10/04/2012 2335    BMET    Component Value Date/Time   NA 139 06/25/2013 0545   K 3.8 06/25/2013 0545   CL 104 06/25/2013 0545   CO2 21 06/25/2013 0545   GLUCOSE 107* 06/25/2013 0545   BUN 5* 06/25/2013 0545   CREATININE 0.68 06/25/2013 0545   CREATININE 0.75 05/24/2013 1547   CALCIUM 8.4 06/25/2013 0545   GFRNONAA >90 06/25/2013 0545   GFRAA >90 06/25/2013 0545     Discharge Instructions:   The patient is discharged to home with extensive instructions on wound care and progressive ambulation.  They are instructed not to drive  or perform any heavy lifting until returning to see the physician in his office.  Discharge Orders   Future Orders Complete By Expires   ABDOMINAL PROCEDURE/ANEURYSM REPAIR/AORTO-BIFEMORAL BYPASS:  Call MD for increased abdominal pain; cramping diarrhea; nausea/vomiting  As directed    Call MD for:  redness, tenderness, or signs of infection (pain, swelling, bleeding, redness, odor or green/yellow discharge around incision site)  As directed    Call MD for:  severe or increased pain, loss or decreased feeling  in affected limb(s)  As directed    Call MD for:  temperature >100.5  As directed    Discharge wound care:  As directed    Driving Restrictions  As directed    Lifting  restrictions  As directed    Resume previous diet  As directed       Discharge Diagnosis:  Aortic Occlusion  Secondary Diagnosis: Patient Active Problem List   Diagnosis Date Noted  . Aortic stenosis 06/20/2013  . Peripheral vascular disease, unspecified 06/10/2013  . Claudication 04/23/2013  . Essential hypertension 04/23/2013   Past Medical History  Diagnosis Date  . Hypertension   . Claudication   . Tobacco abuse   . Hyperlipidemia   . Headache(784.0)   . Blood in stool     SEES OCCASIONAL BLOOD IN STOOL....       Medication List         aspirin EC 81 MG tablet  Take 81 mg by mouth daily as needed for mild pain.     atorvastatin 10 MG tablet  Commonly known as:  LIPITOR  Take 1 tablet (10 mg total) by mouth daily.     multivitamin with minerals Tabs tablet  Take 1 tablet by mouth daily.     oxyCODONE 5 MG immediate release tablet  Commonly known as:  Oxy IR/ROXICODONE  Take 1 tablet (5 mg total) by mouth every 6 (six) hours as needed for severe pain.        OxyIR #30 No Refill  Disposition: home  Patient's condition: is Good  Follow up: 1. Dr. Myra Gianotti in 2 weeks 2. Dr. Kristin Bruins in 1-2 weeks.  She is having a panorex xray before discharge.   [name], PA-C [facility] [phone] 06/26/2013  8:50 AM   - For VQI Registry use ---   Post-op:  Time to Extubation: [ ]  In OR [x]  PACU, [ ]  < 12 hrs, [ ]  12-24 hrs, [ ]  >=24 hrs Vasopressors Req. Post-op: No ICU Stay: 3 days Transfusion: Yes  If yes, 2 units given MI: No, [ ]  Troponin only, [ ]  EKG or Clinical New Arrhythmia: No  Complications: CHF: No Resp failure: No, [ ]  Pneumonia, [ ]  Ventilator Chg in renal function: No, [ ]  Inc. Cr > 0.5, [ ]  Temp. Dialysis, [ ]  Permanent dialysis Leg ischemia: No, no Surgery needed, [ ]  Yes, Surgery needed, [ ]  Amputation Bowel ischemia: No, [ ]  Medical Rx, [ ]  Surgical Rx Wound complication: No, [ ]  Superficial  separation/infection, [ ]  Return to OR Return to OR: No  Return to OR for bleeding: No Stroke: No, [ ]  Minor, [ ]  Major  Discharge medications: Statin use:  Yes If No: [ ]  For Medical reasons, [ ]  Non-compliant ASA use:  Yes  If No: [ ]  For Medical reasons, [ ]  Non-compliant Plavix use:  No If No: [ ]  For Medical reasons, [ ]  Non-compliant Beta blocker use:  No If No: [ ]  For  Medical reasons, [ ]  Non-compliant

## 2013-06-26 NOTE — Progress Notes (Signed)
Went over discharge instructions with patient. No additional questions or concerns related to discharge instructions. IV d/c'd, patient taken off cardiac monitor. Wound vac removed. Patient went for orthopantogram before being discharged. Patient received bedside commode from case manager. Patient discharged home with daughter. Rise Paganinieal R Cherylin [place]aguespack

## 2013-07-01 ENCOUNTER — Other Ambulatory Visit (HOSPITAL_COMMUNITY): Payer: Managed Care, Other (non HMO) | Admitting: Dentistry

## 2013-07-01 NOTE — Discharge Summary (Signed)
I agree with the above  Wells Samhitha Rosen 

## 2013-07-02 ENCOUNTER — Telehealth: Payer: Self-pay

## 2013-07-02 NOTE — Telephone Encounter (Signed)
Phone call from pt's. daughter with report of pt's. c/o constipation.  Stated she hasn't had a BM since discharge on 06/26/13.  Reported that the pt. denies any nausea/ vomiting.   Pt. stated she is passing gas. C/o abdominal discomfort and abdominal bloating.  Advised pt. should increase fluid intake; recommended drinking four 8 oz. glasses H20 or decaffeinated beverage/ day.  Advised to increase mobility; recommended walking around the house / sitting up in chair, several times/ day.  Daughter stated pt. is taking Oxycodone 5 mg, about every 6 hrs/ prn/ pain.  Recommended to have pt. take MOM 15 cc now, increase fluid intake and mobility, and if no results in 6 hrs, repeat MOM 15 cc.  Advised if pt's symptoms worsen, ie: increased bloating/abdominal pain, and nausea/vomiting to take to the ER.  Daughter verb. understanding.

## 2013-07-03 ENCOUNTER — Telehealth (HOSPITAL_COMMUNITY): Payer: Self-pay

## 2013-07-04 ENCOUNTER — Telehealth: Payer: Self-pay

## 2013-07-04 NOTE — Telephone Encounter (Signed)
Phone call from pt's daughter to give update on pt's constipation.  Stated pt. hasn't had a BM, after taking the 2 doses of MOM.  Stated pt. denies any nausea or vomiting; continues to pass gas; abdominal bloating is somewhat better.  Discussed with the nurse practitioner.  Advised to start stool softener, and Miralax.  Advised daughter to start Docusate Sodium 100 mg po, bid, (advised to decrease to daily if starts having loose BM's); and Miralax per package directions, (17 gm in 8 oz. water or juice, daily) until she has a BM.  Daughter verb. Understanding.

## 2013-07-05 ENCOUNTER — Encounter: Payer: Self-pay | Admitting: Surgery

## 2013-07-05 ENCOUNTER — Telehealth: Payer: Self-pay

## 2013-07-05 NOTE — Telephone Encounter (Signed)
Pt's. daughter called to report pt. hasn't had any results from laxatives.  Reported her mother has vomited about 3 times since yesterday, and didn't want to tell anyone, because she was afraid she would have to return to the hospital.  Phone call to Dr. Myra GianottiBrabham.  Jovita GammaGave verbal order to have pt. Take a Fleets enema to attempt to get her bowels moving.  Advised also that pt. could take a suppository for the constipation, but recommended starting with a Fleets enema; if no results, [facility].  Called daughter back and advised of the above recommendations.  Verb. Understanding.

## 2013-07-05 NOTE — Telephone Encounter (Signed)
Called pt. to check on status of her bowels.  Spoke with her daughter, Carollee Herterrincess.  Stated her mother hasn't taken the Fleets enema yet.  Spoke with pt.  Advised her that she should take the enema, because she will feel much better, once her bowels start moving.  Pt. verb. understanding. Reported she will take the enema now. Advised pt. that she should go to the ER, if the nausea and vomiting continues, and if no results from laxatives/enema.  Pt. denies nausea at this time.  Verb. understanding of instructions.

## 2013-07-08 ENCOUNTER — Ambulatory Visit (INDEPENDENT_AMBULATORY_CARE_PROVIDER_SITE_OTHER): Payer: Managed Care, Other (non HMO) | Admitting: Surgery

## 2013-07-08 ENCOUNTER — Encounter: Payer: Self-pay | Admitting: Surgery

## 2013-07-08 VITALS — BP 105/70 | HR 81 | Ht 66.0 in | Wt 159.3 lb

## 2013-07-08 DIAGNOSIS — I739 Peripheral vascular disease, unspecified: Secondary | ICD-10-CM

## 2013-07-08 NOTE — Progress Notes (Signed)
The patient is back today for her first postoperative visit.  On 06/20/2013, she underwent aortobifemoral bypass graft using a 14 x 7 dacryon graft.  I reimplanted her inferior mesenteric artery and to the tubal portion of her bypass.  In order to protect her femoral incisions, she had an incisional wound VAC throughout her hospital stay.  She called the office last week with nausea and vomiting.  She had been constipated and tried multiple stool softeners.  She was given a laxative.  She did get relief with the laxative.  She has not thrown up for greater than 36 hours.  Her appetite is somewhat depressed but she is tolerating oral intake.  She is having regular flatus.  Her rest pain in her right foot has resolved.  On examination her midline and bilateral inguinal incisions are well-healed.  She has palpable pedal pulses bilaterally.  Overall then she is recovering nicely.  I have scheduled her to come back in 3 months.  I gave her a prescription for 30 oxycodone tablets today.  She is scheduled for dental extraction in approximately 6 weeks

## 2013-07-08 NOTE — Addendum Note (Signed)
Addended by: Sharee PimpleMCCHESNEY, Aahna Rossa K on: 07/08/2013 06:54 PM   Modules accepted: Orders

## 2013-07-09 ENCOUNTER — Other Ambulatory Visit (HOSPITAL_COMMUNITY): Payer: Managed Care, Other (non HMO) | Admitting: Dentistry

## 2013-07-09 ENCOUNTER — Telehealth (HOSPITAL_COMMUNITY): Payer: Self-pay | Admitting: Dentistry

## 2013-07-09 NOTE — Telephone Encounter (Signed)
07/09/2013  Patient:            Kelly HewBrenda S Wahlquist Date of Birth:  05/08/1955 MRN:                [id number]  Patient's daughter called at 349 AM for a 10 AM appointment and left a message on machine canceling the dental appointment due to do patient illness. Patient will call back to reschedule dental appointment. Charlynne Panderonald F. Dareen Gutzwiller, DDS

## 2013-10-11 ENCOUNTER — Encounter: Payer: Self-pay | Admitting: Surgery

## 2013-10-14 ENCOUNTER — Encounter: Payer: Self-pay | Admitting: *Deleted

## 2013-10-14 ENCOUNTER — Ambulatory Visit (HOSPITAL_COMMUNITY)
Admission: RE | Admit: 2013-10-14 | Discharge: 2013-10-14 | Disposition: A | Discharge status: Home / Self Care | Payer: Managed Care, Other (non HMO) | Source: Ambulatory Visit | Attending: Surgery | Admitting: Surgery

## 2013-10-14 ENCOUNTER — Encounter: Payer: Self-pay | Admitting: Surgery

## 2013-10-14 ENCOUNTER — Ambulatory Visit (INDEPENDENT_AMBULATORY_CARE_PROVIDER_SITE_OTHER): Payer: Managed Care, Other (non HMO) | Admitting: Surgery

## 2013-10-14 VITALS — BP 155/105 | HR 66 | Ht 66.0 in | Wt 173.8 lb

## 2013-10-14 DIAGNOSIS — M792 Neuralgia and neuritis, unspecified: Secondary | ICD-10-CM

## 2013-10-14 DIAGNOSIS — I739 Peripheral vascular disease, unspecified: Secondary | ICD-10-CM

## 2013-10-14 DIAGNOSIS — IMO0002 Reserved for concepts with insufficient information to code with codable children: Secondary | ICD-10-CM

## 2013-10-14 MED ORDER — GABAPENTIN 300 MG PO CAPS: 300.0000 mg | ORAL_CAPSULE | Freq: Every day | ORAL | Status: DC

## 2013-10-14 NOTE — Addendum Note (Signed)
Addended by: Sharee PimpleMCCHESNEY, MARILYN K on: 10/14/2013 12:41 PM   Modules accepted: Orders

## 2013-10-14 NOTE — Progress Notes (Signed)
Patient name: Kelly [name] MRN: [id number] DOB: 1956/02/18 Sex: female     Chief Complaint  Patient presents with  . Re-evaluation    3 month f/u     HISTORY OF PRESENT ILLNESS: The patient comes in today for followup.  She is status post aortobifemoral bypass graft using a 14 x 7 dacryon graft with reimplantation of the inferior mesenteric artery on 06/20/2013.  This was done for bilateral claudication.  She states that her pain is well controlled now.  She has minimal pain.  She does have tingling in her toes when she wears closed showed shoes.  She denies claudication symptoms.  She denies ulceration.  Past Medical History  Diagnosis Date  . Hypertension   . Claudication   . Tobacco abuse   . Hyperlipidemia   . Headache(784.0)   . Blood in stool     SEES OCCASIONAL BLOOD IN STOOL....    Past Surgical History  Procedure Laterality Date  . Abdominal hysterectomy    . Bladder repair w/ cesarean section    . Aorta - bilateral femoral artery bypass graft N/A 06/20/2013    Procedure: AORTA BIFEMORAL BYPASS GRAFT WITH REIMPLANTATION OF THE INFERIOR MESENTERIC ARTERY;  Surgeon: Nada Libman, MD;  Location: [facility] OR;  Service: Vascular;  Laterality: N/A;    History   Social History  . Marital Status: Legally Separated    Spouse Name: N/A    Number of Children: N/A  . Years of Education: N/A   Occupational History  . Not on file.   Social History Main Topics  . Smoking status: Former Smoker    Types: Cigarettes    Quit date: 06/12/2013  . Smokeless tobacco: Never Used     Comment: states she smokes 0-1 cig per day  . Alcohol Use: 0.6 oz/week    1 Cans of beer per week  . Drug Use: No  . Sexual Activity: Not on file   Other Topics Concern  . Not on file   Social History Narrative  . No narrative on file    Family History  Problem Relation Age of Onset  . Hypertension Mother   . Diabetes Mother   . Hyperlipidemia Mother   . Varicose Veins Mother   .  Heart disease Father   . Hypertension Father   . Heart attack Father   . Hyperlipidemia Sister   . Hypertension Sister   . Diabetes Brother   . Hypertension Brother   . Heart attack Brother     Allergies as of 10/14/2013  . (No Known Allergies)    Current Outpatient Prescriptions on File Prior to Visit  Medication Sig Dispense Refill  . aspirin EC 81 MG tablet Take 81 mg by mouth daily as needed for mild pain.      Marland Kitchen atorvastatin (LIPITOR) 10 MG tablet Take 1 tablet (10 mg total) by mouth daily.  30 tablet  0  . Multiple Vitamin (MULTIVITAMIN WITH MINERALS) TABS tablet Take 1 tablet by mouth daily.      Marland Kitchen oxyCODONE (OXY IR/ROXICODONE) 5 MG immediate release tablet Take 1 tablet (5 mg total) by mouth every 6 (six) hours as needed for severe pain.  30 tablet  0   No current facility-administered medications on file prior to visit.     REVIEW OF SYSTEMS: Cardiovascular: No chest pain, chest pressure, palpitations, orthopnea, or dyspnea on exertion. No claudication or rest pain,  No history of DVT or phlebitis. Pulmonary: No productive  cough, asthma or wheezing. Neurologic: Burning and tingling in her toes Hematologic: No bleeding problems or clotting disorders. Musculoskeletal: No joint pain or joint swelling. Gastrointestinal: No blood in stool or hematemesis Genitourinary: No dysuria or hematuria. Psychiatric:: No history of major depression. Integumentary: No rashes or ulcers. Constitutional: No fever or chills.  PHYSICAL EXAMINATION:   Vital signs are BP 155/105  Pulse 66  Ht 5\' 6"  (1.676 m)  Wt 173 lb 12.8 oz (78.835 kg)  BMI 28.07 kg/m2  SpO2 99% General: The patient appears their stated age. HEENT:  No gross abnormalities Pulmonary:  Non labored breathing Abdomen: Soft and non-tender.  Midline incision healed, no hernia.  Well-healed femoral incision Musculoskeletal: There are no major deformities. Neurologic: No focal weakness or paresthesias are detected, Skin:  There are no ulcer or rashes noted. Psychiatric: The patient has normal affect. Cardiovascular: There is a regular rate and rhythm without significant murmur appreciated.  Palpable femoral pulses   Diagnostic Studies ABIs today were 1.13 on the right and 1.08 on the left.  Both with triphasic waveforms  Assessment: Status post aortobifemoral bypass graft Plan: The patient is doing very well from my perspective.  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  She will followup in one year.  Jorge NyV. Wells Arabelle Bollig IV, M.D. [facility] Office: [phone] Pager:  [phone]

## 2013-10-21 ENCOUNTER — Telehealth (HOSPITAL_COMMUNITY): Payer: Self-pay | Admitting: Dentistry

## 2013-10-21 NOTE — Telephone Encounter (Signed)
10/21/2013  Patient:            Kelly [name] Date of Birth:  28-Aug-1955 MRN:                [id number]  Messages were left at home [phone]) and with daughter Dennard NipCheri [phone]) on 10/16/13 and 10/17/13 attempting to schedule Dental Consultation appointment to discuss extraction of remaining teeth. No response from patient or family at this time. Alternatively, the patient may follow up with another Dentist or Oral Surgeon of her choice.  Dr. Kristin BruinsKulinski

## 2014-02-27 ENCOUNTER — Encounter (HOSPITAL_COMMUNITY): Payer: Self-pay | Admitting: Cardiovascular Disease

## 2014-07-18 IMAGING — CR DG CHEST 2V
2 series · 2 of 2 positions shown · non-contrast
Comparison: 10/05/2012

CLINICAL DATA: Preop for angio procedure

EXAM:
CHEST  2 VIEW

[w chest pa]
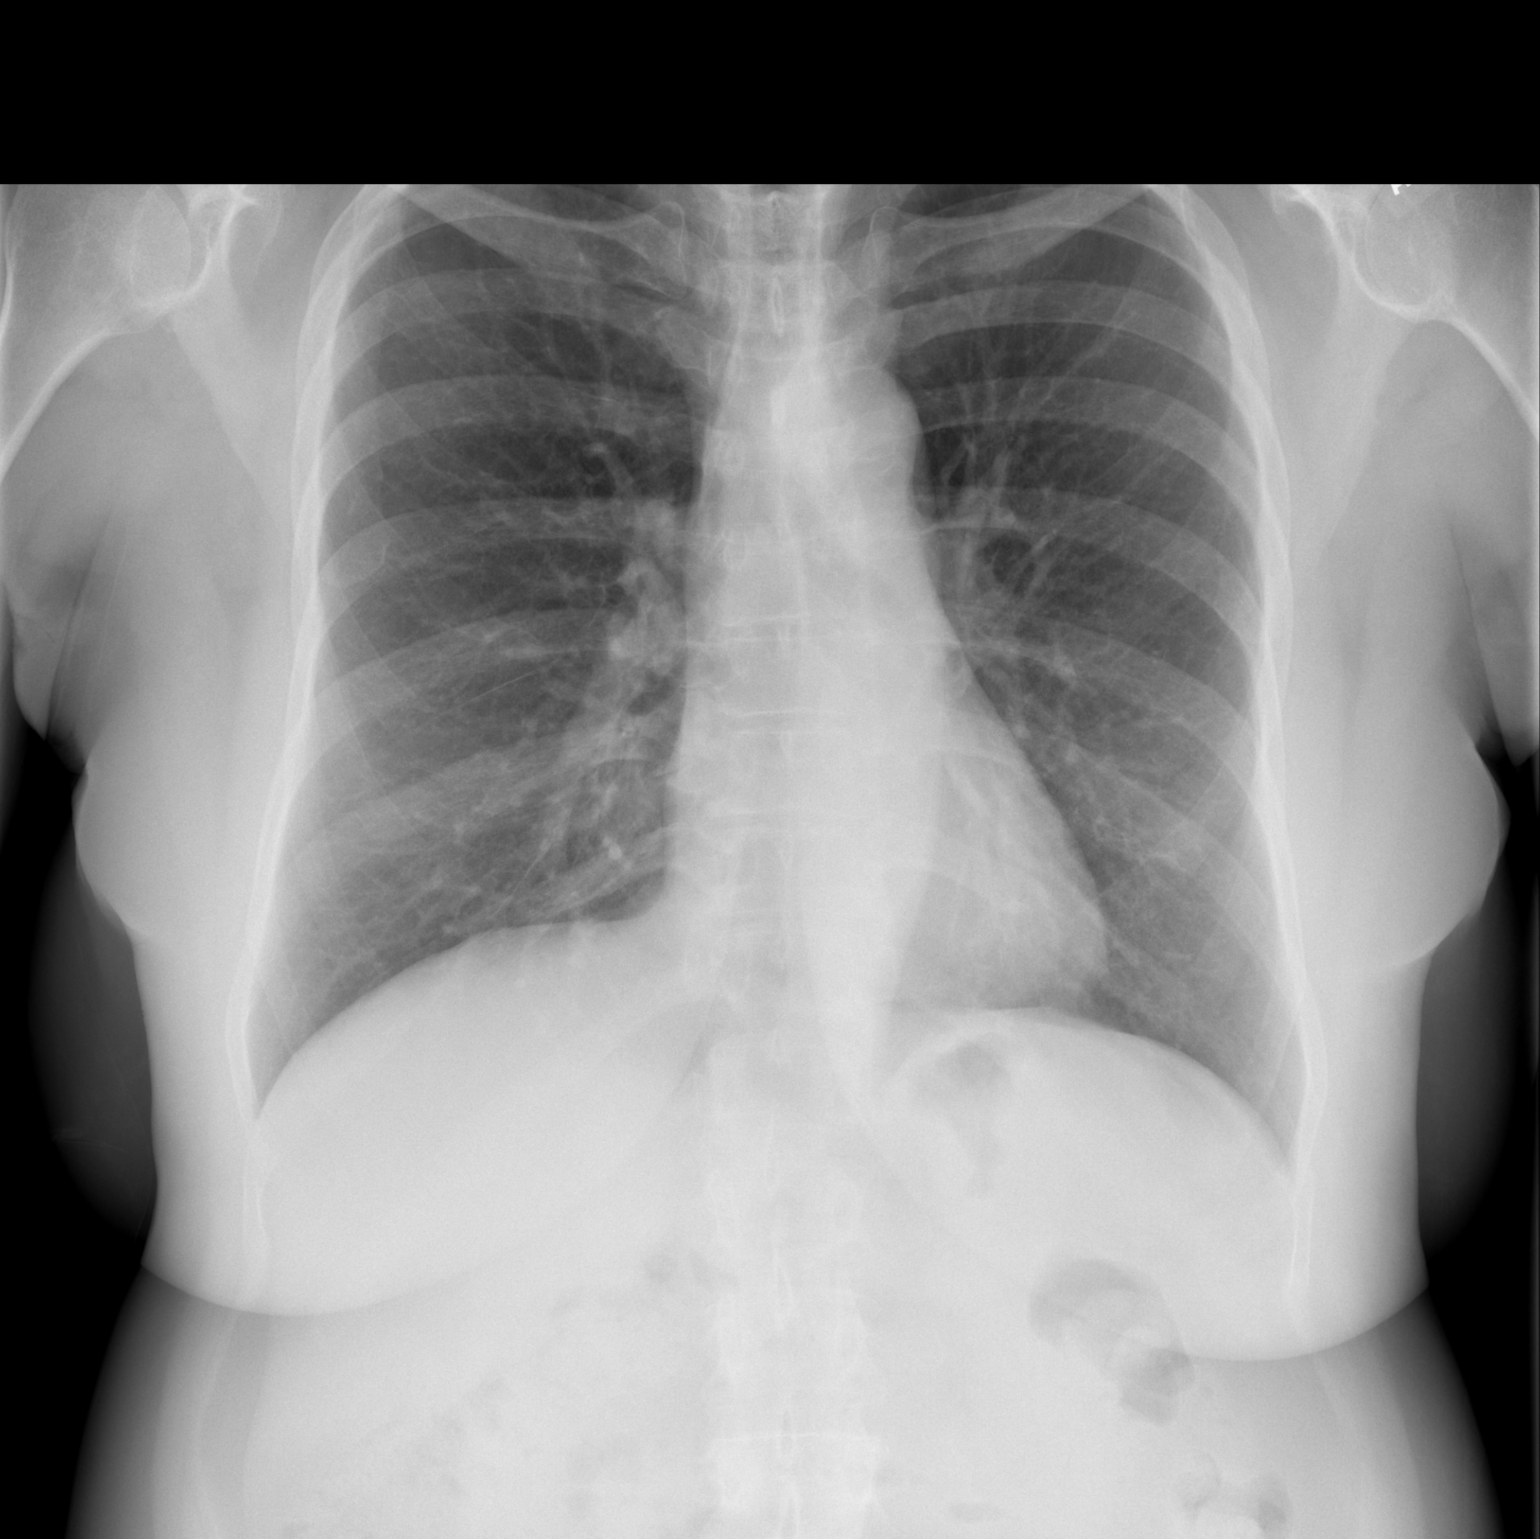

[w chest lat]
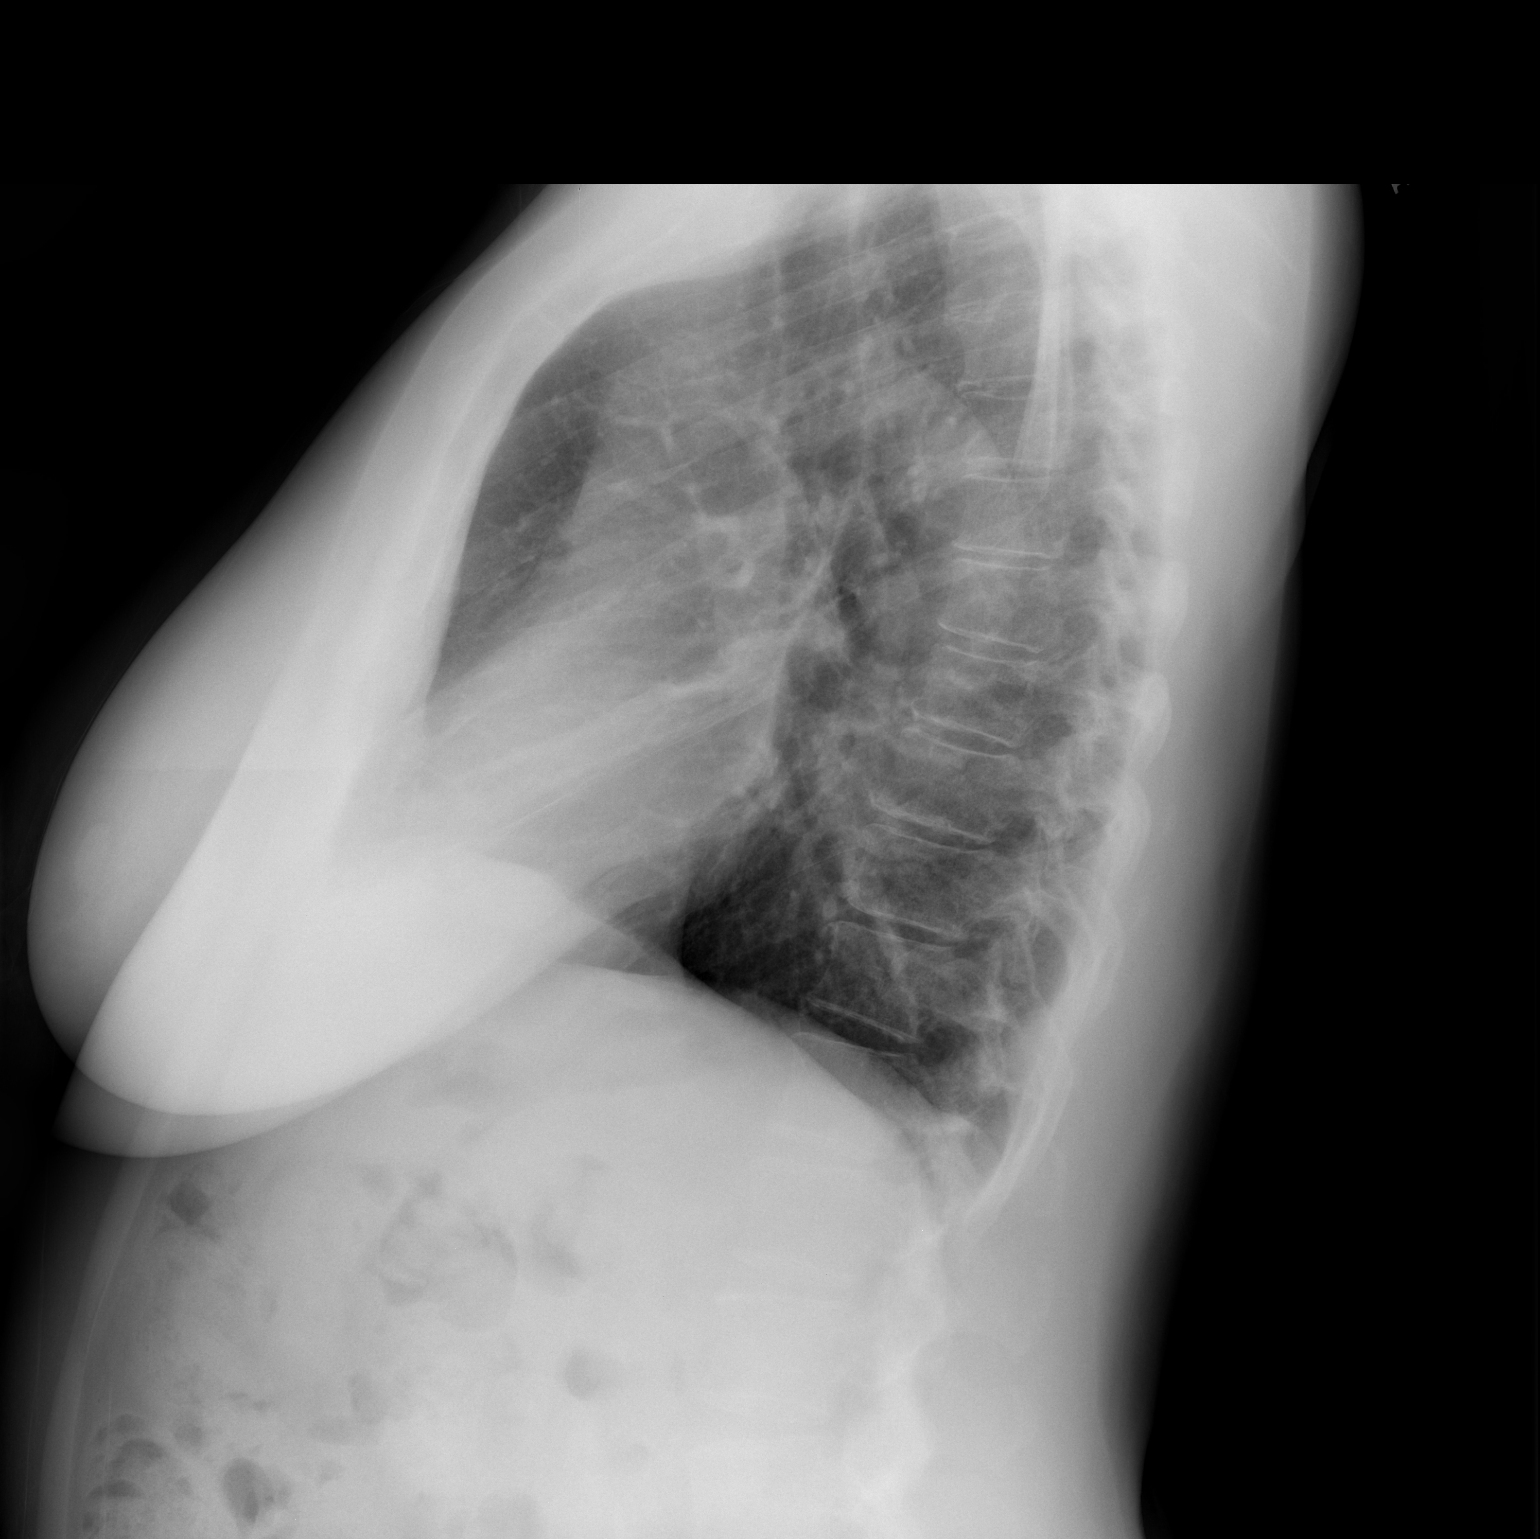

[2 of 2 positions shown; findings below may reference images not displayed]

FINDINGS: Cardiomediastinal silhouette is stable. No acute infiltrate or
pleural effusion. No pulmonary edema. Bony thorax is unremarkable.
IMPRESSION: No active cardiopulmonary disease.

## 2014-08-14 IMAGING — CR DG CHEST 1V PORT
1 series · 1 of 1 positions shown · non-contrast
Comparison: DG CHEST 2 VIEW dated 05/24/2013

CLINICAL DATA: Line placement

EXAM:
PORTABLE CHEST - 1 VIEW

[AP]
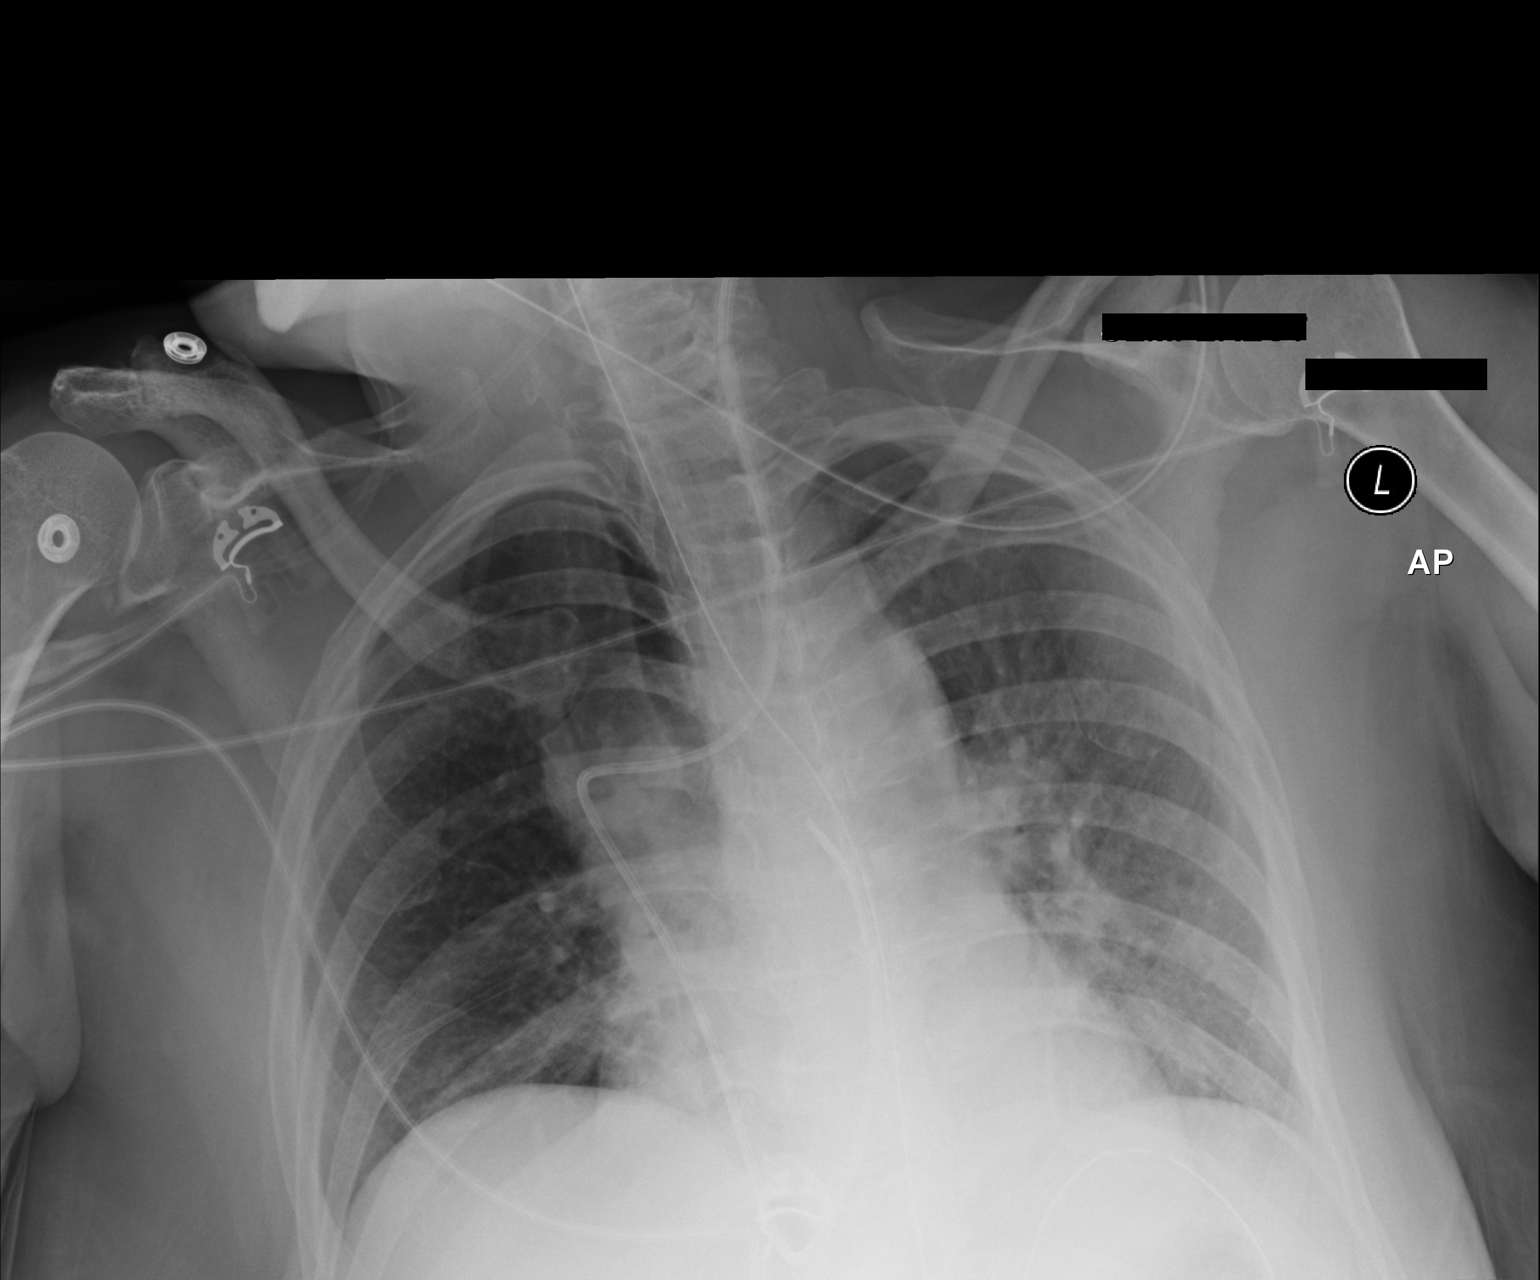

[1 of 1 positions shown; findings below may reference images not displayed]

FINDINGS: Low lung volumes. Cardiac silhouette within upper limits of normal.
Swan-Ganz catheter is appreciated via a left internal jugular
approach tip projecting in the central mediastinum. This appears to
be in the region of the proximal pulmonary artery. An NG tube is
seen portion curled in the region of the stomach. No focal regions
of consolidation are appreciated. There is mild prominence of the
interstitial markings and peribronchial cuffing. Diffuse
ground-glass density in the left hemi thorax. Osseous structures are
unremarkable.
IMPRESSION: 1. Support lines and tubes which appear appropriately positioned.
2. Interstitial infiltrate, mild, likely reflecting pulmonary edema
with asymmetric component in the left hemi thorax.

## 2014-08-15 IMAGING — CR DG CHEST 1V PORT
1 series · 1 of 1 positions shown · non-contrast
Comparison: DG CHEST 1V PORT dated 06/20/2013; DG CHEST 2 VIEW dated
05/24/2013

CLINICAL DATA: Postop

EXAM:
PORTABLE CHEST - 1 VIEW

[AP]
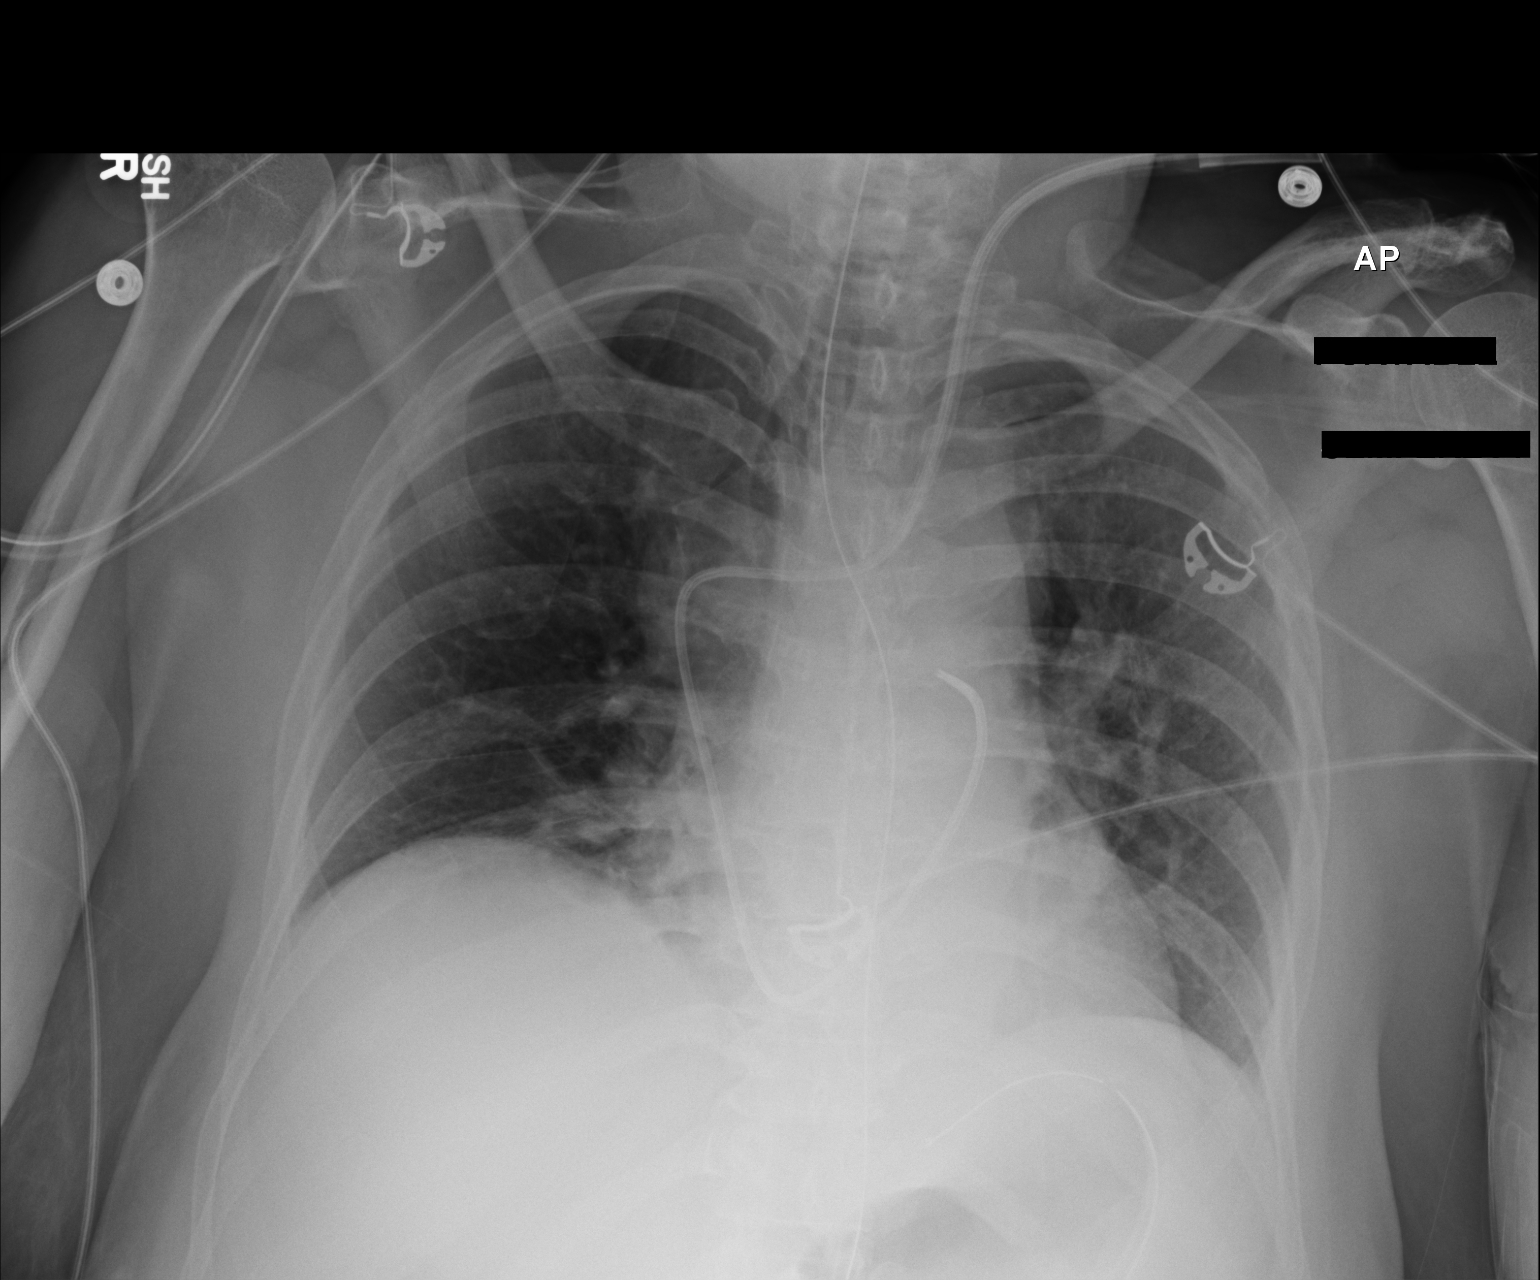

[1 of 1 positions shown; findings below may reference images not displayed]

FINDINGS: Grossly unchanged cardiac silhouette and mediastinal contours.
Stable positioning of support apparatus. No pneumothorax. Lung
volumes remain reduced. No change to minimally improved aeration of
the lungs with residual bilateral infrahilar opacities, left greater
than right. There is grossly unchanged mild asymmetric cephalization
of flow within the left lung. No new focal airspace opacities. No
evidence of edema. Unchanged bones.
IMPRESSION: 1.  Stable positioning of support apparatus.  No pneumothorax.
2. Findings most suggestive of improving asymmetric edema and
atelectasis.

## 2014-08-20 IMAGING — PX DG ORTHOPANTOGRAM /PANORAMIC
1 series · 1 of 1 positions shown · non-contrast
Comparison: None.

CLINICAL DATA: Tooth trauma. Evaluate for retained roots and
periapical pathology.

EXAM:
ORTHOPANTOGRAM/PANORAMIC

[Series 1: — · U · 1 of 1 slices shown]
[im 1/1]
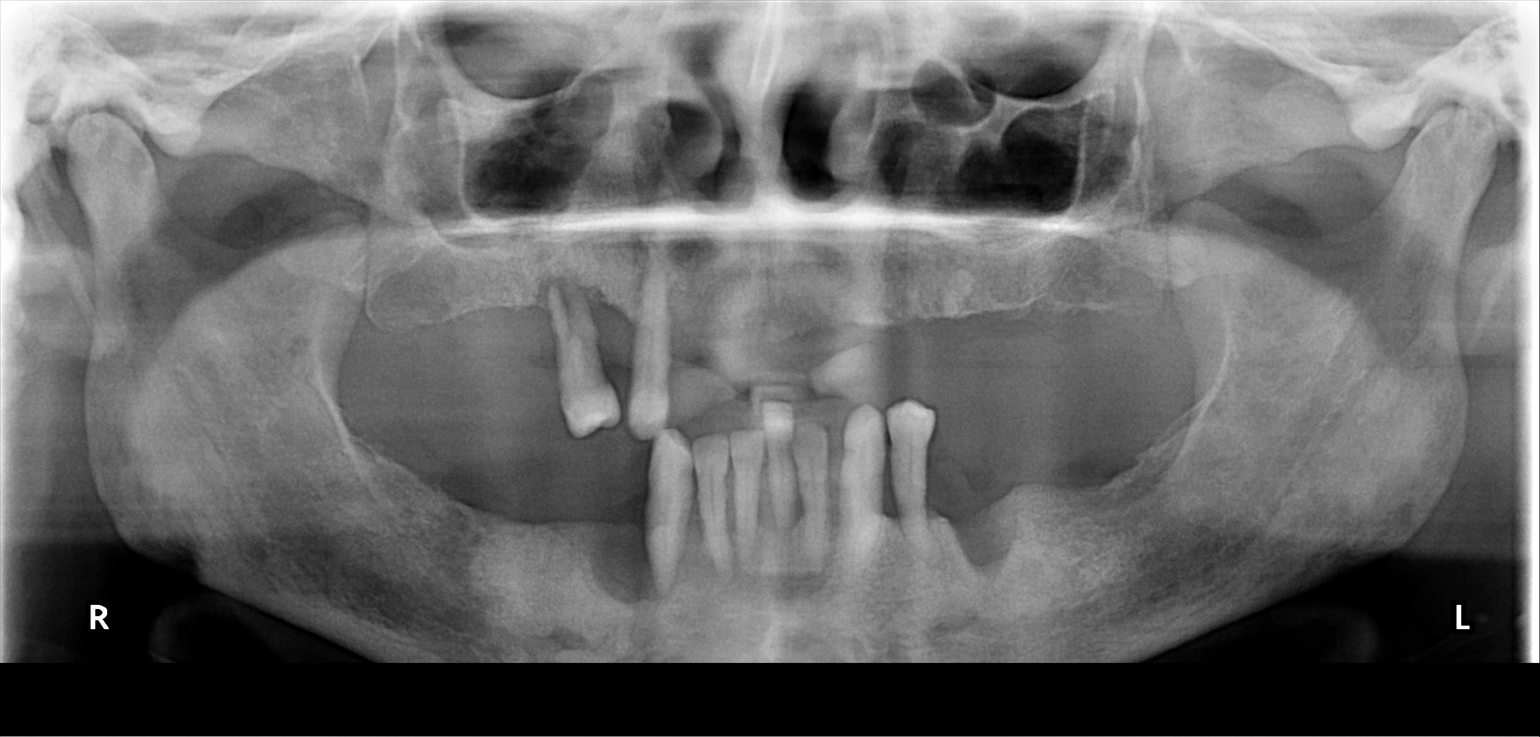

[1 of 1 positions shown; findings below may reference images not displayed]

FINDINGS: There are 2 retained right maxillary teeth, both of which have
periapical lucencies. There are 7 teeth in the mandibular symphysis
with at least one periapical lucency. A tooth to the right of
midline appears completely dislodged from the mandible. Mandibular
condyles are located.
IMPRESSION: 1. Extremely poor dentition with maxillary and mandibular periapical
lucencies, indicative of abscesses.
2. At least 1 dislodged tooth to the right of the midline in the
mandibular symphysis.

## 2014-10-20 ENCOUNTER — Ambulatory Visit: Payer: Managed Care, Other (non HMO) | Admitting: Surgery

## 2014-11-05 ENCOUNTER — Ambulatory Visit: Payer: Self-pay | Admitting: Surgery

## 2014-11-28 ENCOUNTER — Encounter: Payer: Self-pay | Admitting: Surgery

## 2014-12-01 ENCOUNTER — Ambulatory Visit: Payer: Self-pay | Admitting: Surgery

## 2015-06-18 ENCOUNTER — Encounter: Payer: Self-pay | Admitting: Cardiology

## 2015-10-21 ENCOUNTER — Emergency Department (HOSPITAL_COMMUNITY)
Admit: 2015-10-21 | Discharge: 2015-10-21 | Disposition: A | Discharge status: Home / Self Care | Payer: BLUE CROSS/BLUE SHIELD

## 2015-10-21 ENCOUNTER — Encounter (HOSPITAL_COMMUNITY): Payer: Self-pay | Admitting: *Deleted

## 2015-10-21 ENCOUNTER — Emergency Department (HOSPITAL_COMMUNITY)
Admission: EM | Admit: 2015-10-21 | Discharge: 2015-10-21 | Disposition: A | Discharge status: Home / Self Care | Payer: BLUE CROSS/BLUE SHIELD | Attending: Emergency Medicine | Admitting: Emergency Medicine

## 2015-10-21 ENCOUNTER — Emergency Department (HOSPITAL_COMMUNITY): Payer: BLUE CROSS/BLUE SHIELD

## 2015-10-21 ENCOUNTER — Emergency Department (HOSPITAL_BASED_OUTPATIENT_CLINIC_OR_DEPARTMENT_OTHER)
Admit: 2015-10-21 | Discharge: 2015-10-21 | Disposition: A | Discharge status: Home / Self Care | Payer: BLUE CROSS/BLUE SHIELD

## 2015-10-21 DIAGNOSIS — M79609 Pain in unspecified limb: Secondary | ICD-10-CM

## 2015-10-21 DIAGNOSIS — Z87891 Personal history of nicotine dependence: Secondary | ICD-10-CM | POA: Insufficient documentation

## 2015-10-21 DIAGNOSIS — Y929 Unspecified place or not applicable: Secondary | ICD-10-CM | POA: Insufficient documentation

## 2015-10-21 DIAGNOSIS — Z7982 Long term (current) use of aspirin: Secondary | ICD-10-CM | POA: Insufficient documentation

## 2015-10-21 DIAGNOSIS — X58XXXA Exposure to other specified factors, initial encounter: Secondary | ICD-10-CM | POA: Insufficient documentation

## 2015-10-21 DIAGNOSIS — Y939 Activity, unspecified: Secondary | ICD-10-CM | POA: Insufficient documentation

## 2015-10-21 DIAGNOSIS — Y999 Unspecified external cause status: Secondary | ICD-10-CM | POA: Insufficient documentation

## 2015-10-21 DIAGNOSIS — S39012A Strain of muscle, fascia and tendon of lower back, initial encounter: Secondary | ICD-10-CM | POA: Insufficient documentation

## 2015-10-21 DIAGNOSIS — I10 Essential (primary) hypertension: Secondary | ICD-10-CM | POA: Insufficient documentation

## 2015-10-21 LAB — LIPASE, BLOOD: LIPASE: 19 U/L (ref 11–51)

## 2015-10-21 LAB — COMPREHENSIVE METABOLIC PANEL
ALBUMIN: 4 g/dL (ref 3.5–5.0)
ALK PHOS: 65 U/L (ref 38–126)
ALT: 15 U/L (ref 14–54)
ANION GAP: 8 (ref 5–15)
AST: 18 U/L (ref 15–41)
BUN: 7 mg/dL (ref 6–20)
CHLORIDE: 108 mmol/L (ref 101–111)
CO2: 22 mmol/L (ref 22–32)
Calcium: 9.9 mg/dL (ref 8.9–10.3)
Creatinine, Ser: 0.83 mg/dL (ref 0.44–1.00)
GFR calc non Af Amer: >60 mL/min (ref >60)
GLUCOSE: 97 mg/dL (ref 65–99)
Potassium: 3.7 mmol/L (ref 3.5–5.1)
SODIUM: 138 mmol/L (ref 135–145)
Total Bilirubin: 0.5 mg/dL (ref 0.3–1.2)
Total Protein: 8.1 g/dL (ref 6.5–8.1)

## 2015-10-21 LAB — CBC
HCT: 40.4 % (ref 36.0–46.0)
HEMOGLOBIN: 13.2 g/dL (ref 12.0–15.0)
MCH: 25.5 pg — ABNORMAL LOW (ref 26.0–34.0)
MCHC: 32.7 g/dL (ref 30.0–36.0)
MCV: 78 fL (ref 78.0–100.0)
Platelets: 289 10*3/uL (ref 150–400)
RBC: 5.18 MIL/uL — AB (ref 3.87–5.11)
RDW: 15.7 % — ABNORMAL HIGH (ref 11.5–15.5)
WBC: 4.8 10*3/uL (ref 4.0–10.5)

## 2015-10-21 LAB — URINALYSIS, ROUTINE W REFLEX MICROSCOPIC
GLUCOSE, UA: NEGATIVE mg/dL
Hgb urine dipstick: NEGATIVE
Ketones, ur: NEGATIVE mg/dL
Leukocytes, UA: NEGATIVE
Nitrite: NEGATIVE
PH: 5.5 (ref 5.0–8.0)
Protein, ur: NEGATIVE mg/dL
SPECIFIC GRAVITY, URINE: 1.023 (ref 1.005–1.030)

## 2015-10-21 MED ORDER — TRAMADOL HCL 50 MG PO TABS: 50.0000 mg | ORAL_TABLET | Freq: Four times a day (QID) | ORAL | 0 refills | Status: DC | PRN

## 2015-10-21 MED ORDER — MORPHINE SULFATE (PF) 4 MG/ML IV SOLN: 4.0000 mg | Freq: Once | INTRAVENOUS | Status: DC

## 2015-10-21 MED ORDER — CYCLOBENZAPRINE HCL 10 MG PO TABS: 10.0000 mg | ORAL_TABLET | Freq: Two times a day (BID) | ORAL | 0 refills | Status: DC | PRN

## 2015-10-21 NOTE — ED Notes (Signed)
Greta Doom explaining results to patient

## 2015-10-21 NOTE — ED Notes (Signed)
Pt. Transported to xray at this time.  

## 2015-10-21 NOTE — ED Notes (Signed)
Vascular Tech aware of patient 

## 2015-10-21 NOTE — Progress Notes (Signed)
VASCULAR LAB PRELIMINARY  ARTERIAL  ABI completed:    RIGHT    LEFT    PRESSURE WAVEFORM  PRESSURE WAVEFORM  BRACHIAL 163 Triphasic BRACHIAL 157 Triphasic  AT 155 Biphasic AT 104 Monophasic  PT 125 Biphasic PT 160 Biphasic    RIGHT LEFT  ABI 0.95 0.98   ABI's are with in normal limits at rest. However, the waveforms appear falsely evaluated due to calcified vessels  Jenetta Loges, RVT 10/21/2015, 9:39 PM

## 2015-10-21 NOTE — ED Provider Notes (Signed)
[facility]-EMERGENCY DEPT Provider Note   CSN: [id number] Arrival date & time: 10/21/15  1239  First Provider Contact:  None       History   Chief Complaint Chief Complaint  Patient presents with  . Abdominal Pain  . Back Pain    HPI AURIAH OAKDEN is a 60 y.o. female.  HPI   60 year old female with history of hypertension, hyperlipidemia and claudication presenting for evaluation of low back pain and left leg weakness. Patient notice an achy throbbing pain to her low back that radiated across her back ongoing for the past week. His symptoms started after she was cleaning her bathroom which he does on a regular basis. Her pain is worsening with movement especially with walking and for the past 2 days she also noticed some weakness to her left leg. She has been taking ibuprofen without adequate relief. Left leg pain and weakness felt similar to her right leg pain that she had 2 years ago when she was diagnosed with having aortic stenosis and subsequently had an aortobifem stent placed by Dr. Myra Gianotti. Patient currently denies having fever, chills, lightheadedness, dizziness, chest pain, shortness of breath, abdominal pain, or rash. She denies bowel bladder incontinence, no history of IV drug use or active cancer. She does have a history of tobacco abuse.   Past Medical History:  Diagnosis Date  . Blood in stool    SEES OCCASIONAL BLOOD IN STOOL....  . Claudication (HCC)   . Headache(784.0)   . Hyperlipidemia   . Hypertension   . Tobacco abuse     Patient Active Problem List   Diagnosis Date Noted  . Aortic stenosis 06/20/2013  . Peripheral vascular disease, unspecified (HCC) 06/10/2013  . Claudication (HCC) 04/23/2013  . Essential hypertension 04/23/2013    Past Surgical History:  Procedure Laterality Date  . ABDOMINAL AORTAGRAM N/A 05/30/2013   Procedure: ABDOMINAL Ronny Flurry;  Surgeon: Runell Gess, MD;  Location: [facility] CATH LAB;  Service: Cardiovascular;  Laterality: N/A;    . ABDOMINAL HYSTERECTOMY    . AORTA - BILATERAL FEMORAL ARTERY BYPASS GRAFT N/A 06/20/2013   Procedure: AORTA BIFEMORAL BYPASS GRAFT WITH REIMPLANTATION OF THE INFERIOR MESENTERIC ARTERY;  Surgeon: Nada Libman, MD;  Location: [facility] OR;  Service: Vascular;  Laterality: N/A;  . BLADDER REPAIR W/ CESAREAN SECTION      OB History    No data available       Home Medications    Prior to Admission medications   Medication Sig Start Date End Date Taking? Authorizing Provider  aspirin EC 81 MG tablet Take 81 mg by mouth daily as needed for mild pain.    Historical Provider, MD  atorvastatin (LIPITOR) 10 MG tablet Take 1 tablet (10 mg total) by mouth daily. 06/10/13   Nada Libman, MD  gabapentin (NEURONTIN) 300 MG capsule Take 1 capsule (300 mg total) by mouth daily before breakfast. 10/14/13   Nada Libman, MD  Multiple Vitamin (MULTIVITAMIN WITH MINERALS) TABS tablet Take 1 tablet by mouth daily.    Historical Provider, MD  oxyCODONE (OXY IR/ROXICODONE) 5 MG immediate release tablet Take 1 tablet (5 mg total) by mouth every 6 (six) hours as needed for severe pain. 06/26/13   Dara Lords, PA-C    Family History Family History  Problem Relation Age of Onset  . Hypertension Mother   . Diabetes Mother   . Hyperlipidemia Mother   . Varicose Veins Mother   . Heart disease Father   .  Hypertension Father   . Heart attack Father   . Hyperlipidemia Sister   . Hypertension Sister   . Diabetes Brother   . Hypertension Brother   . Heart attack Brother     Social History Social History  Substance Use Topics  . Smoking status: Former Smoker    Types: Cigarettes    Quit date: 06/12/2013  . Smokeless tobacco: Never Used     Comment: states she smokes 0-1 cig per day  . Alcohol use 0.6 oz/week    1 Cans of beer per week     Allergies   Review of patient's allergies indicates no known allergies.   Review of Systems Review of Systems  All other systems reviewed and are  negative.    Physical Exam Updated Vital Signs BP (!) 147/102 (BP Location: Right Arm)   Pulse 66   Temp 97.9 F (36.6 C) (Oral)   Resp 16   Ht  (1.676 m)   Wt 74.8 kg   SpO2 98%   BMI 26.63 kg/m   Physical Exam  Constitutional: She appears well-developed and well-nourished. No distress.  HENT:  Head: Atraumatic.  Eyes: Conjunctivae are normal.  Neck: Neck supple.  Cardiovascular: Normal rate and regular rhythm.   Unable to palpate dorsalis pedis pulse bilaterally. Left posterior tibialis pulse is faint but detectable using bedside Doppler. Cap refills are less than 3 seconds. Left foot is cool compare to the right foot.  Pulmonary/Chest: Effort normal and breath sounds normal.  Abdominal: Soft. There is no tenderness.  Neurological: She is alert.  Equal strength to bilateral lower extremities, patellar deep tendon reflex intact bilaterally, no foot drops.  Skin: No rash noted.  Psychiatric: She has a normal mood and affect.  Nursing note and vitals reviewed.    ED Treatments / Results  Labs (all labs ordered are listed, but only abnormal results are displayed) Labs Reviewed  CBC - Abnormal; Notable for the following:       Result Value   RBC 5.18 (*)    MCH 25.5 (*)    RDW 15.7 (*)    All other components within normal limits  URINALYSIS, ROUTINE W REFLEX MICROSCOPIC (NOT AT [facility]) - Abnormal; Notable for the following:    Bilirubin Urine SMALL (*)    All other components within normal limits  LIPASE, BLOOD  COMPREHENSIVE METABOLIC PANEL    EKG  EKG Interpretation None       Radiology Dg Lumbar Spine Complete  Result Date: 10/21/2015 CLINICAL DATA:  Lumbosacral back pain for 1 week.  No known injury. EXAM: LUMBAR SPINE - COMPLETE 4+ VIEW COMPARISON:  None. FINDINGS: The alignment is maintained. Vertebral body heights are normal. There is no listhesis. Disc space narrowing most prominent at L5-S1 as well as L4-L5. Minimal facet disease at L3-L4 and  L4-L5. No fracture. Sacroiliac joints are symmetric and normal. Multiple retroperitoneal surgical clips. 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 Electronically Signed   By: Rubye Oaks M.D.   On: 10/21/2015 19:23    Procedures Procedures (including critical care time)  Medications Ordered in ED Medications - No data to display   Initial Impression / Assessment and Plan / ED Course  I have reviewed the triage vital signs and the nursing notes.  Pertinent labs & imaging results that were available during my care of the patient were reviewed by me and considered in my medical decision making (see chart  for details).  Clinical Course    BP 148/91 (BP Location: Right Arm)   Pulse (!) 57   Temp 98 F (36.7 C) (Oral)   Resp 15   Ht 5\' 6"  (1.676 m)   Wt 74.8 kg   SpO2 98%   BMI 26.63 kg/m    Final Clinical Impressions(s) / ED Diagnoses   Final diagnoses:  Lumbar strain, initial encounter    New Prescriptions New Prescriptions   CYCLOBENZAPRINE (FLEXERIL) 10 MG TABLET    Take 1 tablet (10 mg total) by mouth 2 (two) times daily as needed for muscle spasms.   TRAMADOL (ULTRAM) 50 MG TABLET    Take 1 tablet (50 mg total) by mouth every 6 (six) hours as needed.    6:10 PM Patient here with reproducible low back pain as well as subjective weakness to her left thigh. She does have a significant history of claudication, prior history of aortic stenosis and prior aortobifem bypass 2 years ago. Her symptoms felt similar to last time that she was discovered to have these occlusive crisis. Therefore, plan to evaluate for potential arterial occlusion with ABI. Care discussed with Dr. Fayrene Fearing. Pain medication given. Patient made nothing by mouth.   10:04 PM ABI shows no significant changes in blood flow. Patient able to ambulate. X-ray of her low back without acute finding. Patient may need an MRI for back  down the road but not today. Will provide referral to orthopedic for further evaluation. Pain medication prescribed. Return percussion discussed. Care discussed with Dr. Fayrene Fearing.   10:03 PM Jenetta Loges, RVT  Vascular Lab    [] Hide copied text [] Hover for attribution information VASCULAR LAB PRELIMINARY  ARTERIAL  ABI completed:    RIGHT   LEFT    PRESSURE WAVEFORM  PRESSURE WAVEFORM  BRACHIAL 163 Triphasic BRACHIAL 157 Triphasic  AT 155 Biphasic AT 104 Monophasic  PT 125 Biphasic PT 160 Biphasic    RIGHT LEFT  ABI 0.95 0.98   ABI's are with in normal limits at rest. However, the waveforms appear falsely evaluated due to calcified vessels  Jenetta Loges, RVT 10/21/2015, 9:39 PM          Fayrene Helper, PA-C 10/21/15 1610    Rolland Porter, MD 11/03/15 [phone]

## 2015-10-21 NOTE — ED Triage Notes (Signed)
Pt reports ongoing lower back pain that is getting progressively worse. Needing assistance to get out of bed in am. Now also having lower back pain. Denies n/v/d. Denies urinary or vaginal symptoms.

## 2016-06-27 ENCOUNTER — Ambulatory Visit (INDEPENDENT_AMBULATORY_CARE_PROVIDER_SITE_OTHER): Payer: BLUE CROSS/BLUE SHIELD | Admitting: Physician Assistant

## 2016-06-27 ENCOUNTER — Encounter: Payer: Self-pay | Admitting: Surgery

## 2016-06-27 VITALS — BP 140/102 | HR 68 | Temp 98.5°F | Resp 20 | Ht 66.0 in | Wt 182.0 lb

## 2016-06-27 DIAGNOSIS — I7409 Other arterial embolism and thrombosis of abdominal aorta: Secondary | ICD-10-CM | POA: Diagnosis not present

## 2016-06-27 DIAGNOSIS — I350 Nonrheumatic aortic (valve) stenosis: Secondary | ICD-10-CM

## 2016-06-27 DIAGNOSIS — I739 Peripheral vascular disease, unspecified: Secondary | ICD-10-CM

## 2016-06-27 NOTE — Progress Notes (Signed)
Patient name: Kelly [name] MRN: [id number] DOB: [birth year] Sex: female    HPI: Kelly [name] is a 61 y.o. female who was first seen 06/10/2013 due to  aortic occlusion.  The patient had a CT angiogram in February which shows an infrarenal aortic occlusion with reconstitution of the femoral vessels and no significant outflow a runoff disease.  She has also undergone angiography which confirms the above findings.  The patient states that she can not walk very far before her legs give out.  She also has rest pain in her right leg.  She continues to smoke.  She has been told she has hypertension, but for financial reasons has not been taking her antihypertensive medication.   She underwent  Aortobifemoral bypass graft using a 14 x 7 dacryon graft with reimplantation of the inferior mesenteric artery by Dr. Myra Gianotti 06/20/2013.  Her last follow up visit was 10/14/2013.  Her ABI's were 1.13 on the right and 1.08 on the left.  Both with triphasic waveforms.  At that time she reported neuropathic pain in her feet and was given Neurontin and a 1 year follow up appointment.  She is here today for numbness in the left lower posterior leg that made walking difficult.  She was seen at [facility] for venous study to r/o DVT, which was negative and arterial study of the left leg.  Since a week ago her numbness has gone away and she is back at work.      Other medical problems include HTN not treated, hypercholesterolemia managed with Lipitor.  She has stopped smoking in 06/12/2013.   Past Medical History:  Diagnosis Date  . Blood in stool    SEES OCCASIONAL BLOOD IN STOOL....  . Claudication (HCC)   . Headache(784.0)   . Hyperlipidemia   . Hypertension   . Tobacco abuse    Past Surgical History:  Procedure Laterality Date  . ABDOMINAL AORTAGRAM N/A 05/30/2013   Procedure: ABDOMINAL Ronny Flurry;  Surgeon: Runell Gess, MD;  Location: [facility] CATH LAB;  Service: Cardiovascular;  Laterality: N/A;  . ABDOMINAL  HYSTERECTOMY    . AORTA - BILATERAL FEMORAL ARTERY BYPASS GRAFT N/A 06/20/2013   Procedure: AORTA BIFEMORAL BYPASS GRAFT WITH REIMPLANTATION OF THE INFERIOR MESENTERIC ARTERY;  Surgeon: Nada Libman, MD;  Location: [facility] OR;  Service: Vascular;  Laterality: N/A;  . BLADDER REPAIR W/ CESAREAN SECTION      Family History  Problem Relation Age of Onset  . Hypertension Mother   . Diabetes Mother   . Hyperlipidemia Mother   . Varicose Veins Mother   . Heart disease Father   . Hypertension Father   . Heart attack Father   . Hyperlipidemia Sister   . Hypertension Sister   . Diabetes Brother   . Hypertension Brother   . Heart attack Brother     SOCIAL HISTORY: Social History   Social History  . Marital status: Legally Separated    Spouse name: N/A  . Number of children: N/A  . Years of education: N/A   Occupational History  . Not on file.   Social History Main Topics  . Smoking status: Former Smoker    Types: Cigarettes    Quit date: 06/12/2013  . Smokeless tobacco: Never Used     Comment: states she smokes 0-1 cig per day  . Alcohol use 0.6 oz/week    1 Cans of beer per week  . Drug use: No  . Sexual activity: Not  on file   Other Topics Concern  . Not on file   Social History Narrative  . No narrative on file    No Known Allergies  Current Outpatient Prescriptions  Medication Sig Dispense Refill  . aspirin EC 81 MG tablet Take 81 mg by mouth daily as needed (for blood thinner).     Marland Kitchen atorvastatin (LIPITOR) 10 MG tablet Take 1 tablet (10 mg total) by mouth daily. 30 tablet 0  . cyclobenzaprine (FLEXERIL) 10 MG tablet Take 1 tablet (10 mg total) by mouth 2 (two) times daily as needed for muscle spasms. 20 tablet 0  . ibuprofen (ADVIL,MOTRIN) 200 MG tablet Take 400 mg by mouth daily as needed for moderate pain.     . Multiple Vitamin (MULTIVITAMIN WITH MINERALS) TABS tablet Take 1 tablet by mouth daily.    . naproxen sodium (ANAPROX) 220 MG tablet Take 440 mg by mouth  daily as needed (for inflammation).    . traMADol (ULTRAM) 50 MG tablet Take 1 tablet (50 mg total) by mouth every 6 (six) hours as needed. 15 tablet 0   No current facility-administered medications for this visit.     ROS:   General:  No weight loss, Fever, chills  HEENT: No recent headaches, no nasal bleeding, no visual changes, no sore throat  Neurologic: No dizziness, blackouts, seizures. No recent symptoms of stroke or mini- stroke. No recent episodes of slurred speech, or temporary blindness.  Cardiac: No recent episodes of chest pain/pressure, no shortness of breath at rest.  No shortness of breath with exertion.  Denies history of atrial fibrillation or irregular heartbeat  Vascular: No history of rest pain in feet.  No history of claudication.  No history of non-healing ulcer, No history of DVT   Pulmonary: No home oxygen, no productive cough, no hemoptysis,  No asthma or wheezing  Musculoskeletal:   Arthritis,  Low back pain,   Joint pain  Hematologic:No history of hypercoagulable state.  No history of easy bleeding.  No history of anemia  Gastrointestinal: No hematochezia or melena,  No gastroesophageal reflux, no trouble swallowing  Urinary:  chronic Kidney disease,  on HD -  MWF or  TTHS,  Burning with urination,  Frequent urination,  Difficulty urinating;   Skin: No rashes  Psychological: No history of anxiety,  No history of depression   Physical Examination  Vitals:   06/27/16 1250  BP: (!) 140/102  Pulse: 68  Resp: 20  Temp: 98.5 F (36.9 C)  TempSrc: Oral  SpO2: 94%  Weight: 182 lb (82.6 kg)  Height:  (1.676 m)    Body mass index is 29.38 kg/m.  General:  Alert and oriented, no acute distress HEENT: Normal Neck: No bruit or JVD Pulmonary: Clear to auscultation bilaterally Cardiac: Regular Rate and Rhythm without murmur Abdomen: Soft, non-tender, non-distended, no mass, no scars Skin: No rash, no  ulcers Extremity Pulses:  2+ radial, brachial, femoral,  Doppler dorsalis pedis and peroneal right, Doppler PT and peroneal left. Musculoskeletal: No deformity or edema  Neurologic: Upper and lower extremity motor 5/5 and symmetric   ASSESSMENT:   Status post aortobifemoral bypass graft  PLAN:  Her symptoms of numbness in the posterior left leg have subsided.  She has palpable femoral pulses with doppler signals in B feet without ulcers, no claudication or rest pain.   We will schedule her for a f/u appointment in 6 months for aortoiliac duplex, B  arterial Duplex and ABI's.  I also advised her to establish with a PCP for maximum medical care and prevention.     Thomasena Edis, Alexsand[facility] PA-C [facility] [place] Office: [phone] Pager: [phone]  MD in clinic Dr. Myra Gianotti

## 2016-06-28 NOTE — Addendum Note (Signed)
Addended by: Burton Apley A on: 06/28/2016 09:58 AM   Modules accepted: Orders

## 2016-11-18 ENCOUNTER — Institutional Professional Consult (permissible substitution): Payer: Managed Care, Other (non HMO) | Admitting: Family Medicine

## 2017-01-03 ENCOUNTER — Ambulatory Visit (INDEPENDENT_AMBULATORY_CARE_PROVIDER_SITE_OTHER)
Admission: RE | Admit: 2017-01-03 | Discharge: 2017-01-03 | Disposition: A | Discharge status: Home / Self Care | Payer: BLUE CROSS/BLUE SHIELD | Source: Ambulatory Visit | Attending: Surgery | Admitting: Surgery

## 2017-01-03 ENCOUNTER — Ambulatory Visit (HOSPITAL_COMMUNITY)
Admission: RE | Admit: 2017-01-03 | Discharge: 2017-01-03 | Disposition: A | Discharge status: Home / Self Care | Payer: BLUE CROSS/BLUE SHIELD | Source: Ambulatory Visit | Attending: Surgery | Admitting: Surgery

## 2017-01-03 ENCOUNTER — Ambulatory Visit (INDEPENDENT_AMBULATORY_CARE_PROVIDER_SITE_OTHER): Payer: BLUE CROSS/BLUE SHIELD | Admitting: Family

## 2017-01-03 ENCOUNTER — Encounter: Payer: Self-pay | Admitting: Family

## 2017-01-03 VITALS — BP 154/97 | HR 54 | Temp 97.1°F | Resp 18 | Ht 66.0 in | Wt 179.0 lb

## 2017-01-03 DIAGNOSIS — I7409 Other arterial embolism and thrombosis of abdominal aorta: Secondary | ICD-10-CM | POA: Diagnosis present

## 2017-01-03 DIAGNOSIS — I350 Nonrheumatic aortic (valve) stenosis: Secondary | ICD-10-CM | POA: Insufficient documentation

## 2017-01-03 DIAGNOSIS — Z95828 Presence of other vascular implants and grafts: Secondary | ICD-10-CM

## 2017-01-03 DIAGNOSIS — I739 Peripheral vascular disease, unspecified: Secondary | ICD-10-CM

## 2017-01-03 DIAGNOSIS — Z87891 Personal history of nicotine dependence: Secondary | ICD-10-CM | POA: Diagnosis not present

## 2017-01-03 LAB — VAS US LOWER EXTREMITY ARTERIAL DUPLEX
LPTIBDISTSYS: 79 cm/s
LSFMPSV: -92 cm/s
LSFPPSV: 83 cm/s
Left ant tibial distal sys: 44 cm/s
Left super femoral dist sys PSV: -70 cm/s
RIGHT ANT DIST TIBAL SYS PSV: 51 cm/s
RSFPPSV: 126 cm/s
RTIBDISTSYS: 31 cm/s
Right super femoral dist sys PSV: -87 cm/s
Right super femoral mid sys PSV: -99 cm/s

## 2017-01-03 NOTE — Progress Notes (Signed)
[facility] [place]   CC: Follow up peripheral artery occlusive disease  History of Present Illness Kelly [name] is a 61 y.o. female who was first seen on 06/10/2013 due to aortic occlusion. The patient had a CT angiogram in February 2015 which showed an infrarenal aortic occlusion with reconstitution of the femoral vessels and no significant outflow a runoff disease. She had also undergone angiography which confirms the above findings. The could not walk very far before her legs gave out. She also had rest pain in her right leg.  She continued to smoke until quitting on 06/12/2013.  She has been told she has hypertension, but for financial reasons has not been taking her antihypertensive medication.  She is s/p Aortobifemoral bypass graft using a 14 x 7 dacryon graft with reimplantation of the inferior mesenteric artery on 06/20/2013 by Dr. Myra Gianotti. At follow up visit on 10/14/2013 her ABI's were 1.13 on the right and 1.08 on the left. Both with triphasic waveforms.  At that time she reported neuropathic pain in her feet and was given Neurontin and a 1 year follow up appointment.   She was seen at [facility] for venous study to r/o DVT, which was negative and arterial study of the left leg.  Other medical problems include HTN not treated, hypercholesterolemia managed not taking prescribed Lipitor.    She was last evaluated by Dr. Myra Gianotti and Judie Petit. [employer] on 06-27-16. At that time pt had palpable femoral pulses with doppler signals in B feet without ulcers, no claudication or rest pain.   Pt was to follow up in 6 months for aortoiliac duplex, B arterial Duplex and ABI's.  She can now walk to her mailbox w/o having to stop, had to stop before the above surgery.  She has numbness and tingling in her right 2nd and 3rd toes since 2015.  She denies non healing wounds in her feet or legs.   She has plantar warts on both feet.   She denies any known hx of stroke or TIA.     Pt Diabetic: No Pt smoker: former smoker, quit in 2015, started about age 36 years  Pt meds include: Statin: not taking, due to no insurance, or has no PCP Betablocker: No ASA: Yes, takes sporadically  Other anticoagulants/antiplatelets: no  Past Medical History:  Diagnosis Date  . Blood in stool    SEES OCCASIONAL BLOOD IN STOOL....  . Claudication (HCC)   . Headache(784.0)   . Hyperlipidemia   . Hypertension   . Tobacco abuse     Social History Social History  Substance Use Topics  . Smoking status: Former Smoker    Types: Cigarettes    Quit date: 06/12/2013  . Smokeless tobacco: Never Used     Comment: states she smokes 0-1 cig per day  . Alcohol use 0.6 oz/week    1 Cans of beer per week    Family History Family History  Problem Relation Age of Onset  . Hypertension Mother   . Diabetes Mother   . Hyperlipidemia Mother   . Varicose Veins Mother   . Heart disease Mother   . Heart disease Father   . Hypertension Father   . Heart attack Father   . Hyperlipidemia Sister   . Hypertension Sister   . Diabetes Brother   . Hypertension Brother   . Heart attack Brother   . Heart disease Brother     Past Surgical History:  Procedure Laterality Date  . ABDOMINAL  AORTAGRAM N/A 05/30/2013   Procedure: ABDOMINAL Ronny Flurry;  Surgeon: Runell Gess, MD;  Location: [facility] CATH LAB;  Service: Cardiovascular;  Laterality: N/A;  . ABDOMINAL HYSTERECTOMY    . AORTA - BILATERAL FEMORAL ARTERY BYPASS GRAFT N/A 06/20/2013   Procedure: AORTA BIFEMORAL BYPASS GRAFT WITH REIMPLANTATION OF THE INFERIOR MESENTERIC ARTERY;  Surgeon: Nada Libman, MD;  Location: [facility] OR;  Service: Vascular;  Laterality: N/A;  . BLADDER REPAIR W/ CESAREAN SECTION      No Known Allergies  Current Outpatient Prescriptions  Medication Sig Dispense Refill  . aspirin EC 81 MG tablet Take 81 mg by mouth daily as needed (for blood thinner).     Marland Kitchen atorvastatin (LIPITOR) 10 MG tablet Take 1 tablet (10 mg  total) by mouth daily. 30 tablet 0  . cyclobenzaprine (FLEXERIL) 10 MG tablet Take 1 tablet (10 mg total) by mouth 2 (two) times daily as needed for muscle spasms. 20 tablet 0  . ibuprofen (ADVIL,MOTRIN) 200 MG tablet Take 400 mg by mouth daily as needed for moderate pain.     . Multiple Vitamin (MULTIVITAMIN WITH MINERALS) TABS tablet Take 1 tablet by mouth daily.    . naproxen sodium (ANAPROX) 220 MG tablet Take 440 mg by mouth daily as needed (for inflammation).    . traMADol (ULTRAM) 50 MG tablet Take 1 tablet (50 mg total) by mouth every 6 (six) hours as needed. 15 tablet 0   No current facility-administered medications for this visit.     ROS: See HPI for pertinent positives and negatives.   Physical Examination  Vitals:   01/03/17 1029 01/03/17 1033  BP: (!) 144/100 (!) 154/97  Pulse: (!) 54   Resp: 18   Temp: (!) 97.1 F (36.2 C)   TempSrc: Oral   SpO2: 98%   Weight: 179 lb (81.2 kg)   Height:  (1.676 m)    Body mass index is 28.89 kg/m.  General: A&O x 3, WDWN, female. HENT: Mild bilateral thyromegaly. Mild acanthosis nigricans encircling neck.   Gait: normal Eyes: PERRLA. Pulmonary: Respirations are non labored, CTAB, good air movement Cardiac: regular Rhythm, no detected murmur.         Carotid Bruits Right Left   Negative Negative   Radial pulses are 1+ palpable bilaterally   Adominal aortic pulse is not palpable                         VASCULAR EXAM: Extremities without ischemic changes, without Gangrene; without open wounds. One plantar wart on the sole of each foot.                                                                                                           LE Pulses Right Left       FEMORAL  2+ palpable  2+ palpable        POPLITEAL  not palpable   not palpable       POSTERIOR TIBIAL  not palpable   1+palpable  DORSALIS PEDIS      ANTERIOR TIBIAL 1+ palpable  not palpable    Abdomen: soft, NT, no palpable  masses. Skin: no rashes, no ulcers noted.  Musculoskeletal: no muscle wasting or atrophy.  Neurologic: A&O X 3; Appropriate Affect ; SENSATION: normal; MOTOR FUNCTION:  moving all extremities equally, motor strength 5/5 throughout. Speech is fluent/normal. CN 2-12 intact.    ASSESSMENT: Kelly [name] is a 61 y.o. female s/p Aortobifemoral bypass graft using a 14 x 7 dacryon graft with reimplantation of the inferior mesenteric artery on 06/20/2013 due to aortic occlusion.  She can now walk to her mailbox w/o having to stop, had to stop before the above surgery.  She has not had a PCP for some time, and has not been taking any previously prescribed medications.  She is somewhat hypertensive today. She has acanthosis nigricans encircling her neck with no known personal hx of DM, but has family hx of DM.  Her thyroid is also mildly enlarged.  She has no PCP; I referred her to a PCP, preferably Internal Med.   DATA  Aortoiliac Duplex (01/03/17): Aorto-bifemoral bypass graft with no evidence of stenosis. No previous post intervention exam for comparison.   Bilateral LE Arterial Duplex (01/03/17): No evidence of significant stenosis in the bilateral lower extremities.  No previous exam for comparison.    ABI (Date: 01/03/2017):   R:   ABI: 0.90 (was 0.95 on 10-31-15, at another facility),   PT: mono  DP: bi  TBI:  0.63  L:   ABI: 0.99 (was 0.98),   PT: tri  DP: mono  TBI: 0.46  Stable bilateral ABI; mild disease on the right, normal in the left.      PLAN:  Referral to PCP since she has none and ran out of all of her medications.  Based on the patient's vascular studies and examination, pt will return to clinic in 6 months with ABI's, bilateral aortoiliac duplex, and bilateral LE arterial duplex.   I discussed in depth with the patient the nature of atherosclerosis, and emphasized the importance of maximal medical management including strict control of blood  pressure, blood glucose, and lipid levels, obtaining regular exercise, and continued cessation of smoking.  The patient is aware that without maximal medical management the underlying atherosclerotic disease process will progress, limiting the benefit of any interventions.  The patient was given information about PAD including signs, symptoms, treatment, what symptoms should prompt the patient to seek immediate medical care, and risk reduction measures to take.  Charisse March, RN, MSN, FNP-C [facility] [place] Phone: [phone]  Clinic MD: Early  01/03/17 10:38 AM

## 2017-01-03 NOTE — Patient Instructions (Signed)

## 2017-04-13 ENCOUNTER — Ambulatory Visit (HOSPITAL_COMMUNITY)
Admission: EM | Admit: 2017-04-13 | Discharge: 2017-04-13 | Disposition: A | Discharge status: Home / Self Care | Payer: BLUE CROSS/BLUE SHIELD | Attending: Family Medicine | Admitting: Family Medicine

## 2017-04-13 ENCOUNTER — Encounter (HOSPITAL_COMMUNITY): Payer: Self-pay | Admitting: Family Medicine

## 2017-04-13 DIAGNOSIS — M549 Dorsalgia, unspecified: Secondary | ICD-10-CM

## 2017-04-13 DIAGNOSIS — M519 Unspecified thoracic, thoracolumbar and lumbosacral intervertebral disc disorder: Secondary | ICD-10-CM | POA: Diagnosis not present

## 2017-04-13 LAB — POCT URINALYSIS DIP (DEVICE)
Bilirubin Urine: NEGATIVE
Glucose, UA: NEGATIVE mg/dL
Hgb urine dipstick: NEGATIVE
KETONES UR: NEGATIVE mg/dL
Leukocytes, UA: NEGATIVE
NITRITE: NEGATIVE
PROTEIN: NEGATIVE mg/dL
Specific Gravity, Urine: 1.025 (ref 1.005–1.030)
UROBILINOGEN UA: 0.2 mg/dL (ref 0.0–1.0)
pH: 5.5 (ref 5.0–8.0)

## 2017-04-13 MED ORDER — HYDROCODONE-ACETAMINOPHEN 5-325 MG PO TABS: 1.0000 | ORAL_TABLET | Freq: Four times a day (QID) | ORAL | 0 refills | Status: DC | PRN

## 2017-04-13 MED ORDER — PREDNISONE 20 MG PO TABS: ORAL_TABLET | ORAL | 0 refills | Status: DC

## 2017-04-13 MED ORDER — TRAMADOL HCL 50 MG PO TABS: 50.0000 mg | ORAL_TABLET | Freq: Four times a day (QID) | ORAL | 0 refills | Status: DC | PRN

## 2017-04-13 NOTE — Discharge Instructions (Signed)
Return if your symptoms are not relieved by Saturday.  Urine shows no infection.

## 2017-04-13 NOTE — ED Triage Notes (Signed)
Pt here for 3 weeks of lower mid back pain but getting worse. sts that usually when she takes aleve it helps and now it isn't helping. sts hurts to move and turn and feels like a knot in back. Denies injury.

## 2017-04-13 NOTE — ED Provider Notes (Signed)
[facility] CARE CENTER   [id number] 04/13/17 Arrival Time: 1348   SUBJECTIVE:  Kelly [name] is a 62 y.o. female who presents to the urgent care with complaint of 3 weeks of lower mid back pain but getting worse. sts that usually when she takes aleve it helps and now it isn't helping. sts hurts to move and turn and feels like a knot in back. Denies injury.   Patient has had peripheral artery disease and underwent a aorta bypass in the past.  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  Patient works as a Investment banker, operational at [employer]  Past Medical History:  Diagnosis Date  . Blood in stool    SEES OCCASIONAL BLOOD IN STOOL....  . Claudication (HCC)   . Headache(784.0)   . Hyperlipidemia   . Hypertension   . Tobacco abuse    Family History  Problem Relation Age of Onset  . Hypertension Mother   . Diabetes Mother   . Hyperlipidemia Mother   . Varicose Veins Mother   . Heart disease Mother   . Heart disease Father   . Hypertension Father   . Heart attack Father   . Hyperlipidemia Sister   . Hypertension Sister   . Diabetes Brother   . Hypertension Brother   . Heart attack Brother   . Heart disease Brother    Social History   Socioeconomic History  . Marital status: Legally Separated    Spouse name: Not on file  . Number of children: Not on file  . Years of education: Not on file  . Highest education level: Not on file  Social Needs  . Financial resource strain: Not on file  . Food insecurity - worry: Not on file  . Food insecurity - inability: Not on file  . Transportation needs - medical: Not on file  . Transportation needs - non-medical: Not on file  Occupational History  . Not on file  Tobacco Use  . Smoking status: Former Smoker    Types: Cigarettes    Last attempt to quit: 06/12/2013    Years since quitting: 3.8  . Smokeless tobacco: Never Used  . Tobacco comment: states she smokes 0-1 cig per day  Substance and Sexual Activity  .  Alcohol use: Yes    Alcohol/week: 0.6 oz    Types: 1 Cans of beer per week  . Drug use: No  . Sexual activity: Not on file  Other Topics Concern  . Not on file  Social History Narrative  . Not on file   No outpatient medications have been marked as taking for the 04/13/17 encounter [facility] Encounter).   No Known Allergies    ROS: As per HPI, remainder of ROS negative.   OBJECTIVE:   Vitals:   04/13/17 1403  BP: (!) 157/94  Pulse: (!) 57  Resp: 18  Temp: 98.3 F (36.8 C)  SpO2: 98%     General appearance: alert; no distress Eyes: PERRL; EOMI; conjunctiva normal HENT: normocephalic; atraumatic;  Neck: supple Abdomen: soft, non-tender; bowel sounds normal; no masses or organomegaly; no guarding or rebound tenderness Back: no CVA tenderness Extremities: no cyanosis or edema; symmetrical with no gross deformities; positive straight leg raising both sides at 30 degrees Skin: warm and dry; 7-second capillary refill in right foot Neurologic: normal gait; grossly normal Psychological: alert and cooperative; normal mood and affect      Labs:  Results for orders placed or performed during the hospital  encounter of 04/13/17  POCT urinalysis dip (device)  Result Value Ref Range   Glucose, UA NEGATIVE NEGATIVE mg/dL   Bilirubin Urine NEGATIVE NEGATIVE   Ketones, ur NEGATIVE NEGATIVE mg/dL   Specific Gravity, Urine 1.025 1.005 - 1.030   Hgb urine dipstick NEGATIVE NEGATIVE   pH 5.5 5.0 - 8.0   Protein, ur NEGATIVE NEGATIVE mg/dL   Urobilinogen, UA 0.2 0.0 - 1.0 mg/dL   Nitrite NEGATIVE NEGATIVE   Leukocytes, UA NEGATIVE NEGATIVE    Labs Reviewed  POCT URINALYSIS DIP (DEVICE)    No results found.     ASSESSMENT & PLAN:  1. Lumbar disc disease     Meds ordered this encounter  Medications  . traMADol (ULTRAM) 50 MG tablet    Sig: Take 1 tablet (50 mg total) by mouth every 6 (six) hours as needed.    Dispense:  20 tablet    Refill:  0  . predniSONE  (DELTASONE) 20 MG tablet    Sig: Two daily with food    Dispense:  10 tablet    Refill:  0  . HYDROcodone-acetaminophen (NORCO) 5-325 MG tablet    Sig: Take 1 tablet by mouth every 6 (six) hours as needed for moderate pain.    Dispense:  12 tablet    Refill:  0    Reviewed expectations re: course of current medical issues. Questions answered. Outlined signs and symptoms indicating need for more acute intervention. Patient verbalized understanding. After Visit Summary given.    Return if your symptoms are not relieved by Saturday.      Elvina SidleLauenstein, Yamen Castrogiovanni, MD 04/13/17 [phone]

## 2018-08-16 ENCOUNTER — Telehealth: Payer: Self-pay | Admitting: *Deleted

## 2018-08-16 ENCOUNTER — Ambulatory Visit (INDEPENDENT_AMBULATORY_CARE_PROVIDER_SITE_OTHER): Payer: Self-pay | Admitting: Internal Medicine

## 2018-08-16 ENCOUNTER — Encounter: Payer: Self-pay | Admitting: Internal Medicine

## 2018-08-16 ENCOUNTER — Other Ambulatory Visit: Payer: Self-pay

## 2018-08-16 VITALS — BP 140/90 | HR 73 | Temp 98.3°F | Ht 66.0 in | Wt 190.9 lb

## 2018-08-16 DIAGNOSIS — E7800 Pure hypercholesterolemia, unspecified: Secondary | ICD-10-CM

## 2018-08-16 DIAGNOSIS — I739 Peripheral vascular disease, unspecified: Secondary | ICD-10-CM

## 2018-08-16 DIAGNOSIS — I350 Nonrheumatic aortic (valve) stenosis: Secondary | ICD-10-CM

## 2018-08-16 DIAGNOSIS — R079 Chest pain, unspecified: Secondary | ICD-10-CM

## 2018-08-16 DIAGNOSIS — I10 Essential (primary) hypertension: Secondary | ICD-10-CM

## 2018-08-16 MED ORDER — ATORVASTATIN CALCIUM 40 MG PO TABS: 40.0000 mg | ORAL_TABLET | Freq: Every day | ORAL | 1 refills | Status: DC

## 2018-08-16 MED ORDER — METOPROLOL TARTRATE 25 MG PO TABS: 25.0000 mg | ORAL_TABLET | Freq: Two times a day (BID) | ORAL | 1 refills | Status: DC

## 2018-08-16 NOTE — Progress Notes (Signed)
New Patient/Acute Office Visit     CC/Reason for Visit: Chest pain Previous PCP: Unknown Last Visit: Unknown  HPI: Kelly [name] is a 63 y.o. female who is coming in today for the above mentioned reasons.  She is past due for annual physical.  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 Myra Gianotti.  She then lost her insurance and has not had medical follow-up or medications since.  She used to be on aspirin, metoprolol and Lipitor.  She used to be a smoker but quit right after her bypass surgery.  She makes this appointment today at the urging of her daughter.  Patient has been having chest pain on and off for the past 3 to 4 months.  She describes the pain as substernal, squeezing, nonradiating, not related to rest or exertion, as an example she had chest pain last night while sitting on the couch.  She denies shortness of breath, lightheadedness, dizziness or palpitations with these episodes.  Review of the chart notes an normal myocardial perfusion stress test in 2015 prior to her bypass surgery.   Past Medical/Surgical History: Past Medical History:  Diagnosis Date  . Blood in stool    SEES OCCASIONAL BLOOD IN STOOL....  . Claudication (HCC)   . Headache(784.0)   . Hyperlipidemia   . Hypertension   . Tobacco abuse     Past Surgical History:  Procedure Laterality Date  . ABDOMINAL AORTAGRAM N/A 05/30/2013   Procedure: ABDOMINAL Ronny Flurry;  Surgeon: Runell Gess, MD;  Location: [facility] CATH LAB;  Service: Cardiovascular;  Laterality: N/A;  . ABDOMINAL HYSTERECTOMY    . AORTA - BILATERAL FEMORAL ARTERY BYPASS GRAFT N/A 06/20/2013   Procedure: AORTA BIFEMORAL BYPASS GRAFT WITH REIMPLANTATION OF THE INFERIOR MESENTERIC ARTERY;  Surgeon: Nada Libman, MD;  Location: [facility] OR;  Service: Vascular;  Laterality: N/A;  . BLADDER REPAIR W/ CESAREAN  SECTION      Social History:  reports that she quit smoking about 5 years ago. Her smoking use included cigarettes. She has never used smokeless tobacco. She reports current alcohol use of about 1.0 standard drinks of alcohol per week. She reports that she does not use drugs.  Allergies: No Known Allergies  Family History:  Family History  Problem Relation Age of Onset  . Hypertension Mother   . Diabetes Mother   . Hyperlipidemia Mother   . Varicose Veins Mother   . Heart disease Mother   . Heart disease Father   . Hypertension Father   . Heart attack Father   . Hyperlipidemia Sister   . Hypertension Sister   . Diabetes Brother   . Hypertension Brother   . Heart attack Brother   . Heart disease Brother      Current Outpatient Medications:  .  aspirin EC 81 MG tablet, Take 81 mg by mouth daily as needed (for blood thinner). , Disp: , Rfl:  .  atorvastatin (LIPITOR) 40 MG tablet, Take 1 tablet (40 mg total) by mouth daily., Disp: 90 tablet, Rfl: 1 .  metoprolol tartrate (LOPRESSOR) 25 MG tablet, Take 1 tablet (25 mg total) by mouth 2 (two) times daily., Disp: 180 tablet, Rfl: 1  Review of Systems:  Constitutional: Denies fever, chills, diaphoresis, appetite change and fatigue.  HEENT: Denies photophobia, eye pain, redness, hearing loss, ear pain, congestion, sore throat, rhinorrhea, sneezing, mouth sores, trouble swallowing,  neck pain, neck stiffness and tinnitus.   Respiratory: Denies SOB, DOE, cough,  and wheezing.   Cardiovascular: Denies  palpitations and leg swelling.  Gastrointestinal: Denies nausea, vomiting, abdominal pain, diarrhea, constipation, blood in stool and abdominal distention.  Genitourinary: Denies dysuria, urgency, frequency, hematuria, flank pain and difficulty urinating.  Endocrine: Denies: hot or cold intolerance, sweats, changes in hair or nails, polyuria, polydipsia. Musculoskeletal: Denies myalgias, back pain, joint swelling, arthralgias and gait  problem.  Skin: Denies pallor, rash and wound.  Neurological: Denies dizziness, seizures, syncope, weakness, light-headedness, numbness and headaches.  Hematological: Denies adenopathy. Easy bruising, personal or family bleeding history  Psychiatric/Behavioral: Denies suicidal ideation, mood changes, confusion, nervousness, sleep disturbance and agitation    Physical Exam: Vitals:   08/16/18 1040  BP: 140/90  Pulse: 73  Temp: 98.3 F (36.8 C)  TempSrc: Oral  SpO2: 98%  Weight: 190 lb 14.4 oz (86.6 kg)  Height: 5\' 6"  (1.676 m)   Body mass index is 30.81 kg/m.   Constitutional: NAD, calm, comfortable Eyes: PERRL, lids and conjunctivae normal ENMT: Mucous membranes are moist.  Neck: normal, supple, no masses, no thyromegaly Respiratory: clear to auscultation bilaterally, no wheezing, no crackles. Normal respiratory effort. No accessory muscle use.  Cardiovascular: Regular rate and rhythm, no murmurs / rubs / gallops. No extremity edema. 2+ pedal pulses. No carotid bruits.  Abdomen: no tenderness, no masses palpated. No hepatosplenomegaly. Bowel sounds positive.  Musculoskeletal: no clubbing / cyanosis. No joint deformity upper and lower extremities. Good ROM, no contractures. Normal muscle tone.  Psychiatric: Normal judgment and insight. Alert and oriented x 3. Normal mood.    Impression and Plan:  Chest pain, unspecified type  -I am concerned about her chest pain given she is a vasculopath with multiple coronary artery disease risk factors who has been off medications for at least 5 years. -EKG done in office today shows sinus rhythm with what appear to be lateral T wave inversions, there is a prior EKG from 2015 that does not look much different than the one done today. -I do not believe we need to send her to the emergency department she is not currently having chest pain and chest pain has not changed from baseline, however I do believe she needs further work-up and will  discuss with cardiology Dr. of the day. -Was able to get in touch with Dr. Herbie Baltimore.  He has agreed that she will need close cardiology follow-up for possible repeat stress imaging versus coronary CT.  We will arrange for her to have an appointment tomorrow at 9:20 AM. -Will send in prescriptions for Lipitor, metoprolol and aspirin. Essential hypertension - Plan: metoprolol tartrate (LOPRESSOR) 25 MG tablet  Peripheral vascular disease, unspecified (HCC)  Aortic valve stenosis, etiology of cardiac valve disease unspecified -This is listed in her chart, however I do not have an echocardiogram on file. -Echo would be beneficial.  Pure hypercholesterolemia  -Restart Lipitor 40 mg today, will check lipids at some point soon  Time spent: 45 minutes. Greater than 50% of this time was spent in direct contact with the patient, coordinating care and discussing relevant ongoing clinical issues.  We have also discussed her care with the cardiologist on-call.  This is a potentially life-threatening condition, she has been advised to proceed to the emergency department if she has any increased chest pain overnight.    Patient Instructions  -Cardiology will call you for a virtual visit tomorrow at 9:20 am.  -Meds have been sent to your  pharmacy.     Chaya JanEstela Hernandez Acosta, MD [facility]

## 2018-08-16 NOTE — Telephone Encounter (Signed)
Kelly [name], thanks for your note. I look forward to seeing her tomorrow and I have reviewed the data from PCP.

## 2018-08-16 NOTE — Telephone Encounter (Signed)
Discussed patient with Dr. Ardyth Harps.  She is concerned the patient is having exertional angina symptoms and seems to be more progressive in nature.  She does have cardiac risk factors.  She is not actively in extremis or actively having chest pain to the point where she should be seen in the emergency room. I did feel she warranted being seen by cardiologist.  Since she is just seen by her PCP with a physical examination and EKG, it is probably fine for her to be seen by video conferencing.  She will be scheduled to see Dr. Jacques Navy.  Bryan Lemma, MD   Anselm Jungling => I added this patient on to your morning schedule for tomorrow.  She was just seen by PCP and had a physical exam done along with EKG.  I figured probably okay for you to do a telemetry visit with her.  She may need a stress test or possibly even cath. I really did not have any more spots to see her today and it would have been hard for her to get here today.  Bryan Lemma, MD

## 2018-08-16 NOTE — Telephone Encounter (Signed)
DR Clydene Laming to Dr Herbie Baltimore  In regards to patient.   appointment schedule for virtual -new patient for tomorrow with dr Jacques Navy  AT 9:20AM  our office will contact patient

## 2018-08-16 NOTE — Patient Instructions (Signed)
-  Cardiology will call you for a virtual visit tomorrow at 9:20 am.  -Meds have been sent to your pharmacy.

## 2018-08-17 ENCOUNTER — Encounter: Payer: Self-pay | Admitting: Internal Medicine

## 2018-08-17 ENCOUNTER — Telehealth (INDEPENDENT_AMBULATORY_CARE_PROVIDER_SITE_OTHER): Payer: Self-pay | Admitting: Internal Medicine

## 2018-08-17 VITALS — Ht 66.0 in | Wt 190.0 lb

## 2018-08-17 DIAGNOSIS — I739 Peripheral vascular disease, unspecified: Secondary | ICD-10-CM

## 2018-08-17 DIAGNOSIS — R072 Precordial pain: Secondary | ICD-10-CM

## 2018-08-17 DIAGNOSIS — R0683 Snoring: Secondary | ICD-10-CM

## 2018-08-17 DIAGNOSIS — R0609 Other forms of dyspnea: Secondary | ICD-10-CM

## 2018-08-17 DIAGNOSIS — I10 Essential (primary) hypertension: Secondary | ICD-10-CM

## 2018-08-17 MED ORDER — NITROGLYCERIN 0.4 MG SL SUBL: 0.4000 mg | SUBLINGUAL_TABLET | SUBLINGUAL | 3 refills | Status: DC | PRN

## 2018-08-17 NOTE — Progress Notes (Signed)
Virtual Visit via Video Note   This visit type was conducted due to national recommendations for restrictions regarding the COVID-19 Pandemic (e.g. social distancing) in an effort to limit this patient's exposure and mitigate transmission in our community.  Due to her co-morbid illnesses, this patient is at least at moderate risk for complications without adequate follow up.  This format is felt to be most appropriate for this patient at this time.  All issues noted in this document were discussed and addressed.  A limited physical exam was performed with this format.  Please refer to the patient's chart for her consent to telehealth for [facility].   Date:  08/17/2018   ID:  Kelly [name], DOB 1955/11/25, MRN [id number]  Patient Location: Home Provider Location: Home  PCP:  Philip AspenHernandez Acosta, Limmie PatriciaEstela Y, MD  Cardiologist:  No primary care provider on file.  Electrophysiologist:  None   Evaluation Performed:  New Patient Evaluation  Chief Complaint:  Chest pain  History of Present Illness:    Kelly [name] is a 63 y.o. female with hypertension, hyperlipidemia, peripheral artery disease who had an aortobifemoral bypass due to bilateral leg claudication in 2015 by Dr. Myra GianottiBrabham. She then lost her insurance and has not had medical follow-up or medications since.  She used to be on aspirin, metoprolol and Lipitor.  She used to be a smoker but quit right after her bypass surgery.   She experiences a "grabbing" sensation right in the middle of her chest, and she will drink water and goes away in less than 5 min. If she stays calm that helps. Water is best cure per her report. Has not tried nitro SL. Will occur at random times of day - but she can feel it coming on. Happens 4x a mo for a year. Last episode day before yesterday, got up and walked to bedroom and got water. When it occurs she feels like she's got to move, that helps. Does not occur with exertion. She will however have sob with  exertion. 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 She was able to sit, and in a few minutes return to work after drinking water.   R leg claudication, 4 mins of walking and has to stop. Stable claudication after surgery in 2015. Able to easily walk to mailbox now where prior to surgery this was difficult. She doesn't note having claudication before the stay at home order in March. 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 ABIs reviewed from 12/2016 documenting RLE mild arterial occlusive disease.  Significant daily heartburn distinct sensation from chest discomfort - water helps, and if she takes tums it goes away. Trigger foods include pizza, spaghetti. She feels food sits in her chest sometimes. Has not had a recent UGI eval.   Snoring very loud and daily per daughter.  The patient denies palpitations, PND, orthopnea, or leg swelling. Denies syncope or presyncope. Denies dizziness or lightheadedness. Endorses snoring and has not been evaluated for sleep apnea.  Family history of heart disease with a father who died of a myocardial function at age 63 and several brothers who have had heart disease as well.   The patient does not have symptoms concerning for COVID-19 infection (fever, chills, cough, or new shortness of breath).    Past Medical History:  Diagnosis Date  . Blood in stool    SEES OCCASIONAL BLOOD IN  STOOL....  . Claudication (HCC)   . Headache(784.0)   . Hyperlipidemia   . Hypertension   . Tobacco abuse    Past Surgical History:  Procedure Laterality Date  . ABDOMINAL AORTAGRAM N/A 05/30/2013   Procedure: ABDOMINAL Ronny Flurry;  Surgeon: Runell Gess, MD;  Location: [facility] CATH LAB;  Service: Cardiovascular;  Laterality: N/A;  . ABDOMINAL HYSTERECTOMY    . AORTA - BILATERAL FEMORAL ARTERY BYPASS GRAFT N/A 06/20/2013   Procedure: AORTA BIFEMORAL BYPASS GRAFT WITH REIMPLANTATION OF THE INFERIOR  MESENTERIC ARTERY;  Surgeon: Nada Libman, MD;  Location: [facility] OR;  Service: Vascular;  Laterality: N/A;  . BLADDER REPAIR W/ CESAREAN SECTION       No outpatient medications have been marked as taking for the 08/17/18 encounter (Telemedicine) with Parke Poisson, MD.     Allergies:   Patient has no known allergies.   Social History   Tobacco Use  . Smoking status: Former Smoker    Types: Cigarettes    Last attempt to quit: 06/12/2013    Years since quitting: 5.1  . Smokeless tobacco: Never Used  . Tobacco comment: states she smokes 0-1 cig per day  Substance Use Topics  . Alcohol use: Yes    Alcohol/week: 1.0 standard drinks    Types: 1 Cans of beer per week  . Drug use: No     Family Hx: The patient's family history includes Diabetes in her brother and mother; Heart attack in her brother and father; Heart disease in her brother, father, and mother; Hyperlipidemia in her mother and sister; Hypertension in her brother, father, mother, and sister; Varicose Veins in her mother.  ROS:   Please see the history of present illness.     All other systems reviewed and are negative.   Prior CV studies:   The following studies were reviewed today:  ABI 12/2016, ECG 08/16/2018, CT angio 05/24/2013  Labs/Other Tests and Data Reviewed:    EKG:  An ECG dated 08/16/2018 was personally reviewed today and demonstrated:  SR, lateral T wave inversions  Recent Labs: No results found for requested labs within last 8760 hours.   Recent Lipid Panel No results found for: CHOL, TRIG, HDL, CHOLHDL, LDLCALC, LDLDIRECT  Wt Readings from Last 3 Encounters:  08/17/18 190 lb (86.2 kg)  08/16/18 190 lb 14.4 oz (86.6 kg)  01/03/17 179 lb (81.2 kg)     Objective:    Vital Signs:  Ht 5\' 6"  (1.676 m)   Wt 190 lb (86.2 kg)   BMI 30.67 kg/m    VITAL SIGNS:  reviewed GEN:  no acute distress EYES:  sclerae anicteric, EOMI - Extraocular Movements Intact ENT: poor dentition RESPIRATORY:   normal respiratory effort, symmetric expansion CARDIOVASCULAR:  no peripheral edema SKIN:  no rash, lesions or ulcers. MUSCULOSKELETAL:  no obvious deformities. NEURO:  alert and oriented x 3, no obvious focal deficit PSYCH:  normal affect   ASSESSMENT & PLAN:    1. Precordial pain   2. Dyspnea on exertion   3. Snoring   4. PVD (peripheral vascular disease) with claudication (HCC)   5. Claudication (HCC)   6. Essential hypertension     Chest pain - we will obtain a nuclear stress test, with significant peripheral vascular disease and resting chest pain, this needs to be investigated for ischemia. Will provide nitro SL and instructions for use.  Will start ASA, metoprolol, and lipitor as provided by PCP.   DOE - will obtain echo for structure/function.  EMR documents AS, I do not see an echo in our system for review.   Snoring - will obtain sleep study to evaluate, in setting of DOE and chest pain, need to assess right heart as well with echo.   Claudication - after thorough discussion this appears stable since 2015. Will need f/u with PCP for lower extremity exams.   GERD - recommend f/u with PCP for food sitting in chest sensation and therapy and guidance on heartburn management.    COVID-19 Education: The signs and symptoms of COVID-19 were discussed with the patient and how to seek care for testing (follow up with PCP or arrange E-visit).  The importance of social distancing was discussed today.  Time:   Today, I have spent 37 minutes with the patient with telehealth technology discussing the above problems.   Medication Adjustments/Labs and Tests Ordered: Current medicines are reviewed at length with the patient today.  Concerns regarding medicines are outlined above.   Tests Ordered: Orders Placed This Encounter  Procedures  . MYOCARDIAL PERFUSION IMAGING  . ECHOCARDIOGRAM COMPLETE  . Split night study    Medication Changes: Meds ordered this encounter  Medications   . nitroGLYCERIN (NITROSTAT) 0.4 MG SL tablet    Sig: Place 1 tablet (0.4 mg total) under the tongue every 5 (five) minutes as needed for chest pain.    Dispense:  90 tablet    Refill:  3    Disposition:  Follow up in 1 month(s)  Signed, Parke Poisson, MD  08/17/2018 11:22 AM    [facility]

## 2018-08-17 NOTE — Patient Instructions (Signed)
Medication Instructions:  NTG SCRIPT SENT TO THE PHARMACY If you need a refill on your cardiac medications before your next appointment, please call your pharmacy.   Lab work: If you have labs (blood work) drawn today and your tests are completely normal, you will receive your results only by: Marland Kitchen MyChart Message (if you have MyChart) OR . A paper copy in the mail If you have any lab test that is abnormal or we need to change your treatment, we will call you to review the results.  Testing/Procedures: Your physician has requested that you have an echocardiogram. Echocardiography is a painless test that uses sound waves to create images of your heart. It provides your doctor with information about the size and shape of your heart and how well your heart's chambers and valves are working. This procedure takes approximately one hour. There are no restrictions for this procedure.  [address]  Your physician has requested that you have a lexiscan myoview. For further information please visit [email or web address] Please follow instruction sheet, as given.  [address]  Your physician has recommended that you have a sleep study. This test records several body functions during sleep, including: brain activity, eye movement, oxygen and carbon dioxide blood levels, heart rate and rhythm, breathing rate and rhythm, the flow of air through your mouth and nose, snoring, body muscle movements, and chest and belly movement.  [facility]   Follow-Up: At [facility], you and your health needs are our priority.  As part of our continuing mission to provide you with exceptional heart care, we have created designated Provider Care Teams.  These Care Teams include your primary Cardiologist (physician) and Advanced Practice Providers (APPs -  Physician Assistants and Nurse Practitioners) who all work together to provide you with the care you need, when you need it. Your  physician recommends that you schedule a follow-up appointment June 25-VIDEO VISIT

## 2018-08-20 ENCOUNTER — Encounter: Payer: Self-pay | Admitting: Internal Medicine

## 2018-09-04 ENCOUNTER — Telehealth (HOSPITAL_COMMUNITY): Payer: Self-pay

## 2018-09-04 NOTE — Telephone Encounter (Signed)
New message   Just an FYI. We have made several attempts to contact this patient including sending a letter to schedule or reschedule their echocardiogram & MYOCARDIAL PERFUSION We will be removing the patient from the WQ.   Thank you  6.16.20 @ 12:26pm vm not set up - Kelly [name]  6.8.2020 @ 8:40am vm not set up - both # are the same - Kelly [name]  6.3.20 @ 11:27am both # are the same - vm not set up Kelly [name]  6.1.20 @ 11:31am Both # are the same  - vm not set up - mail reminder letter Kelly [name]

## 2018-09-07 ENCOUNTER — Telehealth: Payer: Self-pay | Admitting: Internal Medicine

## 2018-09-07 NOTE — Telephone Encounter (Signed)
self pay/ home phone/ my chart/ consent/ pre reg completed

## 2018-09-12 ENCOUNTER — Telehealth: Payer: Self-pay

## 2018-09-12 NOTE — Telephone Encounter (Signed)
I called pt to confirm her appointment for 09-13-18 with Dr Margaretann Loveless.

## 2018-09-12 NOTE — Telephone Encounter (Signed)
Contacted patient to reschedule appointment unto she has had her cardiac testing done.Marland Kitchen   Spoke to patient she expressed concerns of the covid 19 and that she did not want to go into the office or hospital until the covid 19 had cleared up  And she also said she was working on getting insurance to pay for the testing so once she has insurance she will get everything scheduled.

## 2018-09-13 ENCOUNTER — Telehealth: Payer: Self-pay | Admitting: Internal Medicine

## 2018-09-13 ENCOUNTER — Telehealth: Payer: Self-pay | Admitting: *Deleted

## 2018-09-13 NOTE — Telephone Encounter (Signed)
Received: Today Message Contents  Lexine Baton, LPN        I just spoke with this patient and she states she will call back in a couple of months to schedule this testing and follow up.   Previous Messages  ----- Message -----  From: Earvin Hansen, LPN  Sent: 5/63/8937 10:05 AM EDT  To: Raiford Simmonds, RN, [facility] Scheduling   Hello all  This patient needs Echo and myoview level 1 and follow up in office visit 7/10 per Dr Margaretann Loveless  Please call patient to arrange  Thanks  Rip Harbour

## 2018-09-13 NOTE — Telephone Encounter (Signed)
Spoke with patient regarding the Echo, myoview and follow up visit ordered by Dr. Margaretann Loveless.  Patient states she will call back in a couple of months to schedule

## 2019-09-09 ENCOUNTER — Telehealth: Payer: Self-pay | Admitting: Internal Medicine

## 2019-09-09 NOTE — Telephone Encounter (Signed)
Need a refill on atorvastatin (LIPITOR) 40 MG tablet and metoprolol tartrate (LOPRESSOR) 25 MG tablet sent to [id number] - [place], [place] - [address] AT [facility] [address] Phone:  [phone]  Fax:  [phone]

## 2019-09-10 ENCOUNTER — Other Ambulatory Visit: Payer: Self-pay | Admitting: Internal Medicine

## 2019-09-10 ENCOUNTER — Other Ambulatory Visit: Payer: Self-pay

## 2019-09-10 DIAGNOSIS — I10 Essential (primary) hypertension: Secondary | ICD-10-CM

## 2019-09-10 DIAGNOSIS — E7800 Pure hypercholesterolemia, unspecified: Secondary | ICD-10-CM

## 2019-09-10 NOTE — Telephone Encounter (Signed)
Attempted to call patient to schedule an appointment.  Patient needs labs for refills.

## 2019-09-11 ENCOUNTER — Ambulatory Visit (INDEPENDENT_AMBULATORY_CARE_PROVIDER_SITE_OTHER): Payer: Medicaid Other | Admitting: Internal Medicine

## 2019-09-11 ENCOUNTER — Encounter: Payer: Self-pay | Admitting: Internal Medicine

## 2019-09-11 VITALS — BP 150/96 | HR 69 | Temp 97.9°F | Wt 194.0 lb

## 2019-09-11 DIAGNOSIS — I10 Essential (primary) hypertension: Secondary | ICD-10-CM

## 2019-09-11 DIAGNOSIS — E7800 Pure hypercholesterolemia, unspecified: Secondary | ICD-10-CM

## 2019-09-11 DIAGNOSIS — R079 Chest pain, unspecified: Secondary | ICD-10-CM

## 2019-09-11 MED ORDER — METOPROLOL TARTRATE 25 MG PO TABS: 25.0000 mg | ORAL_TABLET | Freq: Two times a day (BID) | ORAL | 1 refills | Status: DC

## 2019-09-11 MED ORDER — ATORVASTATIN CALCIUM 40 MG PO TABS: 40.0000 mg | ORAL_TABLET | Freq: Every day | ORAL | 1 refills | Status: DC

## 2019-09-11 NOTE — Patient Instructions (Addendum)
-Nice seeing you today!!  -Referral to cardiology has been sent.  -Schedule follow up in 6 weeks for your blood pressure.  -Low salt diet (see below).   DASH Eating Plan DASH stands for "Dietary Approaches to Stop Hypertension." The DASH eating plan is a healthy eating plan that has been shown to reduce high blood pressure (hypertension). It may also reduce your risk for type 2 diabetes, heart disease, and stroke. The DASH eating plan may also help with weight loss. What are tips for following this plan?  General guidelines  Avoid eating more than 2,300 mg (milligrams) of salt (sodium) a day. If you have hypertension, you may need to reduce your sodium intake to 1,500 mg a day.  Limit alcohol intake to no more than 1 drink a day for nonpregnant women and 2 drinks a day for men. One drink equals 12 oz of beer, 5 oz of wine, or 1 oz of hard liquor.  Work with your health care provider to maintain a healthy body weight or to lose weight. Ask what an ideal weight is for you.  Get at least 30 minutes of exercise that causes your heart to beat faster (aerobic exercise) most days of the week. Activities may include walking, swimming, or biking.  Work with your health care provider or diet and nutrition specialist (dietitian) to adjust your eating plan to your individual calorie needs. Reading food labels   Check food labels for the amount of sodium per serving. Choose foods with less than 5 percent of the Daily Value of sodium. Generally, foods with less than 300 mg of sodium per serving fit into this eating plan.  To find whole grains, look for the word "whole" as the first word in the ingredient list. Shopping  Buy products labeled as "low-sodium" or "no salt added."  Buy fresh foods. Avoid canned foods and premade or frozen meals. Cooking  Avoid adding salt when cooking. Use salt-free seasonings or herbs instead of table salt or sea salt. Check with your health care provider or  pharmacist before using salt substitutes.  Do not fry foods. Cook foods using healthy methods such as baking, boiling, grilling, and broiling instead.  Cook with heart-healthy oils, such as olive, canola, soybean, or sunflower oil. Meal planning  Eat a balanced diet that includes: ? 5 or more servings of fruits and vegetables each day. At each meal, try to fill half of your plate with fruits and vegetables. ? Up to 6-8 servings of whole grains each day. ? Less than 6 oz of lean meat, poultry, or fish each day. A 3-oz serving of meat is about the same size as a deck of cards. One egg equals 1 oz. ? 2 servings of low-fat dairy each day. ? A serving of nuts, seeds, or beans 5 times each week. ? Heart-healthy fats. Healthy fats called Omega-3 fatty acids are found in foods such as flaxseeds and coldwater fish, like sardines, salmon, and mackerel.  Limit how much you eat of the following: ? Canned or prepackaged foods. ? Food that is high in trans fat, such as fried foods. ? Food that is high in saturated fat, such as fatty meat. ? Sweets, desserts, sugary drinks, and other foods with added sugar. ? Full-fat dairy products.  Do not salt foods before eating.  Try to eat at least 2 vegetarian meals each week.  Eat more home-cooked food and less restaurant, buffet, and fast food.  When eating at a restaurant, ask that  your food be prepared with less salt or no salt, if possible. What foods are recommended? The items listed may not be a complete list. Talk with your dietitian about what dietary choices are best for you. Grains Whole-grain or whole-wheat bread. Whole-grain or whole-wheat pasta. Brown rice. Modena Morrow. Bulgur. Whole-grain and low-sodium cereals. Pita bread. Low-fat, low-sodium crackers. Whole-wheat flour tortillas. Vegetables Fresh or frozen vegetables (raw, steamed, roasted, or grilled). Low-sodium or reduced-sodium tomato and vegetable juice. Low-sodium or  reduced-sodium tomato sauce and tomato paste. Low-sodium or reduced-sodium canned vegetables. Fruits All fresh, dried, or frozen fruit. Canned fruit in natural juice (without added sugar). Meat and other protein foods Skinless chicken or [place]. Ground chicken or [place]. Pork with fat trimmed off. Fish and seafood. Egg whites. Dried beans, peas, or lentils. Unsalted nuts, nut butters, and seeds. Unsalted canned beans. Lean cuts of beef with fat trimmed off. Low-sodium, lean deli meat. Dairy Low-fat (1%) or fat-free (skim) milk. Fat-free, low-fat, or reduced-fat cheeses. Nonfat, low-sodium ricotta or cottage cheese. Low-fat or nonfat yogurt. Low-fat, low-sodium cheese. Fats and oils Soft margarine without trans fats. Vegetable oil. Low-fat, reduced-fat, or light mayonnaise and salad dressings (reduced-sodium). Canola, safflower, olive, soybean, and sunflower oils. Avocado. Seasoning and other foods Herbs. Spices. Seasoning mixes without salt. Unsalted popcorn and pretzels. Fat-free sweets. What foods are not recommended? The items listed may not be a complete list. Talk with your dietitian about what dietary choices are best for you. Grains Baked goods made with fat, such as croissants, muffins, or some breads. Dry pasta or rice meal packs. Vegetables Creamed or fried vegetables. Vegetables in a cheese sauce. Regular canned vegetables (not low-sodium or reduced-sodium). Regular canned tomato sauce and paste (not low-sodium or reduced-sodium). Regular tomato and vegetable juice (not low-sodium or reduced-sodium). Angie Fava. Olives. Fruits Canned fruit in a light or heavy syrup. Fried fruit. Fruit in cream or butter sauce. Meat and other protein foods Fatty cuts of meat. Ribs. Fried meat. Berniece Salines. Sausage. Bologna and other processed lunch meats. Salami. Fatback. Hotdogs. Bratwurst. Salted nuts and seeds. Canned beans with added salt. Canned or smoked fish. Whole eggs or egg yolks. Chicken or [place]  with skin. Dairy Whole or 2% milk, cream, and half-and-half. Whole or full-fat cream cheese. Whole-fat or sweetened yogurt. Full-fat cheese. Nondairy creamers. Whipped toppings. Processed cheese and cheese spreads. Fats and oils Butter. Stick margarine. Lard. Shortening. Ghee. Bacon fat. Tropical oils, such as coconut, palm kernel, or palm oil. Seasoning and other foods Salted popcorn and pretzels. Onion salt, garlic salt, seasoned salt, table salt, and sea salt. Worcestershire sauce. Tartar sauce. Barbecue sauce. Teriyaki sauce. Soy sauce, including reduced-sodium. Steak sauce. Canned and packaged gravies. Fish sauce. Oyster sauce. Cocktail sauce. Horseradish that you find on the shelf. Ketchup. Mustard. Meat flavorings and tenderizers. Bouillon cubes. Hot sauce and Tabasco sauce. Premade or packaged marinades. Premade or packaged taco seasonings. Relishes. Regular salad dressings. Where to find more information:  National Heart, Lung, and [place]: https://wilson-eaton.com/  American Heart Association: www.heart.org Summary  The DASH eating plan is a healthy eating plan that has been shown to reduce high blood pressure (hypertension). It may also reduce your risk for type 2 diabetes, heart disease, and stroke.  With the DASH eating plan, you should limit salt (sodium) intake to 2,300 mg a day. If you have hypertension, you may need to reduce your sodium intake to 1,500 mg a day.  When on the DASH eating plan, aim to eat more fresh fruits and  vegetables, whole grains, lean proteins, low-fat dairy, and heart-healthy fats.  Work with your health care provider or diet and nutrition specialist (dietitian) to adjust your eating plan to your individual calorie needs. This information is not intended to replace advice given to you by your health care provider. Make sure you discuss any questions you have with your health care provider. Document Revised: 02/17/2017 Document Reviewed:  02/29/2016 Elsevier Patient Education  2020 [employer].

## 2019-09-11 NOTE — Telephone Encounter (Signed)
Patient was seen in office today 09/11/19

## 2019-09-11 NOTE — Progress Notes (Signed)
Established Patient Office Visit     This visit occurred during the SARS-CoV-2 public health emergency.  Safety protocols were in place, including screening questions prior to the visit, additional usage of staff PPE, and extensive cleaning of exam room while observing appropriate contact time as indicated for disinfecting solutions.    CC/Reason for Visit: Follow-up chronic conditions, medication refills  HPI: Kelly [name] is a 64 y.o. female who is coming in today for the above mentioned reasons. 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 Myra Gianotti.    Since then she lost her medical insurance and has been lost to follow-up until she saw me in May 2020.  At that time she was having significant chest pain, we referred her to cardiology and she was seen the next day with plans to proceed to nuclear stress test however she never followed through due to the Covid pandemic.  She still has intermittent episodes of chest pains.  She is here mainly today because her medication refills were denied due to lack of follow-up.  All her vaccinations are overdue, she has not had any recent cancer screenings.  Other than the occasional chest pain she has no complaints today.  She is here with her niece today who seems a strong advocate for her to resume medical care.   Past Medical/Surgical History: Past Medical History:  Diagnosis Date  . Blood in stool    SEES OCCASIONAL BLOOD IN STOOL....  . Claudication (HCC)   . Headache(784.0)   . Hyperlipidemia   . Hypertension   . Tobacco abuse     Past Surgical History:  Procedure Laterality Date  . ABDOMINAL AORTAGRAM N/A 05/30/2013   Procedure: ABDOMINAL Ronny Flurry;  Surgeon: Runell Gess, MD;  Location: [facility] CATH LAB;  Service: Cardiovascular;  Laterality: N/A;  . ABDOMINAL HYSTERECTOMY    . AORTA -  BILATERAL FEMORAL ARTERY BYPASS GRAFT N/A 06/20/2013   Procedure: AORTA BIFEMORAL BYPASS GRAFT WITH REIMPLANTATION OF THE INFERIOR MESENTERIC ARTERY;  Surgeon: Nada Libman, MD;  Location: [facility] OR;  Service: Vascular;  Laterality: N/A;  . BLADDER REPAIR W/ CESAREAN SECTION      Social History:  reports that she quit smoking about 6 years ago. Her smoking use included cigarettes. She has never used smokeless tobacco. She reports current alcohol use of about 1.0 standard drink of alcohol per week. She reports that she does not use drugs.  Allergies: No Known Allergies  Family History:  Family History  Problem Relation Age of Onset  . Hypertension Mother   . Diabetes Mother   . Hyperlipidemia Mother   . Varicose Veins Mother   . Heart disease Mother   . Heart disease Father   . Hypertension Father   . Heart attack Father   . Hyperlipidemia Sister   . Hypertension Sister   . Diabetes Brother   . Hypertension Brother   . Heart attack Brother   . Heart disease Brother      Current Outpatient Medications:  .  aspirin EC 81 MG tablet, Take 81 mg by mouth daily as needed (for blood thinner). , Disp: , Rfl:  .  atorvastatin (LIPITOR) 40 MG tablet, Take 1 tablet (40 mg total) by mouth daily., Disp: 90 tablet, Rfl: 1 .  metoprolol tartrate (LOPRESSOR) 25 MG tablet, Take 1 tablet (25 mg total) by mouth 2 (two) times  daily., Disp: 180 tablet, Rfl: 1 .  nitroGLYCERIN (NITROSTAT) 0.4 MG SL tablet, Place 1 tablet (0.4 mg total) under the tongue every 5 (five) minutes as needed for chest pain., Disp: 90 tablet, Rfl: 3  Review of Systems:  Constitutional: Denies fever, chills, diaphoresis, appetite change and fatigue.  HEENT: Denies photophobia, eye pain, redness, hearing loss, ear pain, congestion, sore throat, rhinorrhea, sneezing, mouth sores, trouble swallowing, neck pain, neck stiffness and tinnitus.   Respiratory: Denies SOB, DOE, cough, chest tightness,  and wheezing.   Cardiovascular:  Denies chest pain, palpitations and leg swelling.  Gastrointestinal: Denies nausea, vomiting, abdominal pain, diarrhea, constipation, blood in stool and abdominal distention.  Genitourinary: Denies dysuria, urgency, frequency, hematuria, flank pain and difficulty urinating.  Endocrine: Denies: hot or cold intolerance, sweats, changes in hair or nails, polyuria, polydipsia. Musculoskeletal: Denies myalgias, back pain, joint swelling, arthralgias and gait problem.  Skin: Denies pallor, rash and wound.  Neurological: Denies dizziness, seizures, syncope, weakness, light-headedness, numbness and headaches.  Hematological: Denies adenopathy. Easy bruising, personal or family bleeding history  Psychiatric/Behavioral: Denies suicidal ideation, mood changes, confusion, nervousness, sleep disturbance and agitation    Physical Exam: Vitals:   09/11/19 1354  BP: (!) 150/96  Pulse: 69  Temp: 97.9 F (36.6 C)  TempSrc: Temporal  SpO2: 97%  Weight: 194 lb (88 kg)    Body mass index is 31.31 kg/m.   Constitutional: NAD, calm, comfortable Eyes: PERRL, lids and conjunctivae normal ENMT: Mucous membranes are moist.  Respiratory: clear to auscultation bilaterally, no wheezing, no crackles. Normal respiratory effort. No accessory muscle use.  Cardiovascular: Regular rate and rhythm, no murmurs / rubs / gallops. No extremity edema.  Neurologic: Grossly intact and nonfocal. Psychiatric: Normal judgment and insight. Alert and oriented x 3. Normal mood.    Impression and Plan:  Chest pain, unspecified type  -None at present, refer back to cardiology.  Pure hypercholesterolemia  - Plan: atorvastatin (LIPITOR) 40 MG tablet -She will return in 6 weeks for annual physical and lipid check.  Essential hypertension  -Suspect due to lack of medications. -Resend prescription for metoprolol and follow-up in 6 weeks.   Patient Instructions  -Nice seeing you today!!  -Referral to cardiology has been  sent.  -Schedule follow up in 6 weeks for your blood pressure.  -Low salt diet (see below).   DASH Eating Plan DASH stands for "Dietary Approaches to Stop Hypertension." The DASH eating plan is a healthy eating plan that has been shown to reduce high blood pressure (hypertension). It may also reduce your risk for type 2 diabetes, heart disease, and stroke. The DASH eating plan may also help with weight loss. What are tips for following this plan?  General guidelines  Avoid eating more than 2,300 mg (milligrams) of salt (sodium) a day. If you have hypertension, you may need to reduce your sodium intake to 1,500 mg a day.  Limit alcohol intake to no more than 1 drink a day for nonpregnant women and 2 drinks a day for men. One drink equals 12 oz of beer, 5 oz of wine, or 1 oz of hard liquor.  Work with your health care provider to maintain a healthy body weight or to lose weight. Ask what an ideal weight is for you.  Get at least 30 minutes of exercise that causes your heart to beat faster (aerobic exercise) most days of the week. Activities may include walking, swimming, or biking.  Work with your health care provider or diet and  nutrition specialist (dietitian) to adjust your eating plan to your individual calorie needs. Reading food labels   Check food labels for the amount of sodium per serving. Choose foods with less than 5 percent of the Daily Value of sodium. Generally, foods with less than 300 mg of sodium per serving fit into this eating plan.  To find whole grains, look for the word "whole" as the first word in the ingredient list. Shopping  Buy products labeled as "low-sodium" or "no salt added."  Buy fresh foods. Avoid canned foods and premade or frozen meals. Cooking  Avoid adding salt when cooking. Use salt-free seasonings or herbs instead of table salt or sea salt. Check with your health care provider or pharmacist before using salt substitutes.  Do not fry foods.  Cook foods using healthy methods such as baking, boiling, grilling, and broiling instead.  Cook with heart-healthy oils, such as olive, canola, soybean, or sunflower oil. Meal planning  Eat a balanced diet that includes: ? 5 or more servings of fruits and vegetables each day. At each meal, try to fill half of your plate with fruits and vegetables. ? Up to 6-8 servings of whole grains each day. ? Less than 6 oz of lean meat, poultry, or fish each day. A 3-oz serving of meat is about the same size as a deck of cards. One egg equals 1 oz. ? 2 servings of low-fat dairy each day. ? A serving of nuts, seeds, or beans 5 times each week. ? Heart-healthy fats. Healthy fats called Omega-3 fatty acids are found in foods such as flaxseeds and coldwater fish, like sardines, salmon, and mackerel.  Limit how much you eat of the following: ? Canned or prepackaged foods. ? Food that is high in trans fat, such as fried foods. ? Food that is high in saturated fat, such as fatty meat. ? Sweets, desserts, sugary drinks, and other foods with added sugar. ? Full-fat dairy products.  Do not salt foods before eating.  Try to eat at least 2 vegetarian meals each week.  Eat more home-cooked food and less restaurant, buffet, and fast food.  When eating at a restaurant, ask that your food be prepared with less salt or no salt, if possible. What foods are recommended? The items listed may not be a complete list. Talk with your dietitian about what dietary choices are best for you. Grains Whole-grain or whole-wheat bread. Whole-grain or whole-wheat pasta. Brown rice. Modena Morrow. Bulgur. Whole-grain and low-sodium cereals. Pita bread. Low-fat, low-sodium crackers. Whole-wheat flour tortillas. Vegetables Fresh or frozen vegetables (raw, steamed, roasted, or grilled). Low-sodium or reduced-sodium tomato and vegetable juice. Low-sodium or reduced-sodium tomato sauce and tomato paste. Low-sodium or reduced-sodium  canned vegetables. Fruits All fresh, dried, or frozen fruit. Canned fruit in natural juice (without added sugar). Meat and other protein foods Skinless chicken or [place]. Ground chicken or [place]. Pork with fat trimmed off. Fish and seafood. Egg whites. Dried beans, peas, or lentils. Unsalted nuts, nut butters, and seeds. Unsalted canned beans. Lean cuts of beef with fat trimmed off. Low-sodium, lean deli meat. Dairy Low-fat (1%) or fat-free (skim) milk. Fat-free, low-fat, or reduced-fat cheeses. Nonfat, low-sodium ricotta or cottage cheese. Low-fat or nonfat yogurt. Low-fat, low-sodium cheese. Fats and oils Soft margarine without trans fats. Vegetable oil. Low-fat, reduced-fat, or light mayonnaise and salad dressings (reduced-sodium). Canola, safflower, olive, soybean, and sunflower oils. Avocado. Seasoning and other foods Herbs. Spices. Seasoning mixes without salt. Unsalted popcorn and pretzels. Fat-free sweets. What  foods are not recommended? The items listed may not be a complete list. Talk with your dietitian about what dietary choices are best for you. Grains Baked goods made with fat, such as croissants, muffins, or some breads. Dry pasta or rice meal packs. Vegetables Creamed or fried vegetables. Vegetables in a cheese sauce. Regular canned vegetables (not low-sodium or reduced-sodium). Regular canned tomato sauce and paste (not low-sodium or reduced-sodium). Regular tomato and vegetable juice (not low-sodium or reduced-sodium). Rosita Fire. Olives. Fruits Canned fruit in a light or heavy syrup. Fried fruit. Fruit in cream or butter sauce. Meat and other protein foods Fatty cuts of meat. Ribs. Fried meat. Tomasa Blase. Sausage. Bologna and other processed lunch meats. Salami. Fatback. Hotdogs. Bratwurst. Salted nuts and seeds. Canned beans with added salt. Canned or smoked fish. Whole eggs or egg yolks. Chicken or [place] with skin. Dairy Whole or 2% milk, cream, and half-and-half. Whole or  full-fat cream cheese. Whole-fat or sweetened yogurt. Full-fat cheese. Nondairy creamers. Whipped toppings. Processed cheese and cheese spreads. Fats and oils Butter. Stick margarine. Lard. Shortening. Ghee. Bacon fat. Tropical oils, such as coconut, palm kernel, or palm oil. Seasoning and other foods Salted popcorn and pretzels. Onion salt, garlic salt, seasoned salt, table salt, and sea salt. Worcestershire sauce. Tartar sauce. Barbecue sauce. Teriyaki sauce. Soy sauce, including reduced-sodium. Steak sauce. Canned and packaged gravies. Fish sauce. Oyster sauce. Cocktail sauce. Horseradish that you find on the shelf. Ketchup. Mustard. Meat flavorings and tenderizers. Bouillon cubes. Hot sauce and Tabasco sauce. Premade or packaged marinades. Premade or packaged taco seasonings. Relishes. Regular salad dressings. Where to find more information:  National Heart, Lung, and Blood Institute: PopSteam.is  American Heart Association: www.heart.org Summary  The DASH eating plan is a healthy eating plan that has been shown to reduce high blood pressure (hypertension). It may also reduce your risk for type 2 diabetes, heart disease, and stroke.  With the DASH eating plan, you should limit salt (sodium) intake to 2,300 mg a day. If you have hypertension, you may need to reduce your sodium intake to 1,500 mg a day.  When on the DASH eating plan, aim to eat more fresh fruits and vegetables, whole grains, lean proteins, low-fat dairy, and heart-healthy fats.  Work with your health care provider or diet and nutrition specialist (dietitian) to adjust your eating plan to your individual calorie needs. This information is not intended to replace advice given to you by your health care provider. Make sure you discuss any questions you have with your health care provider. Document Revised: 02/17/2017 Document Reviewed: 02/29/2016 Elsevier Patient Education  2020 Elsevier Inc.      Chaya Jan, MD [facility]

## 2019-09-18 ENCOUNTER — Encounter: Payer: Self-pay | Admitting: General Practice

## 2019-10-23 ENCOUNTER — Ambulatory Visit: Payer: Medicaid Other | Admitting: Internal Medicine

## 2020-10-13 ENCOUNTER — Other Ambulatory Visit: Payer: Self-pay

## 2020-10-13 ENCOUNTER — Ambulatory Visit (INDEPENDENT_AMBULATORY_CARE_PROVIDER_SITE_OTHER): Payer: Medicare Other | Admitting: Internal Medicine

## 2020-10-13 ENCOUNTER — Encounter: Payer: Self-pay | Admitting: Internal Medicine

## 2020-10-13 VITALS — BP 140/90 | HR 67 | Temp 98.0°F | Ht 66.0 in | Wt 190.1 lb

## 2020-10-13 DIAGNOSIS — Z1231 Encounter for screening mammogram for malignant neoplasm of breast: Secondary | ICD-10-CM

## 2020-10-13 DIAGNOSIS — Z1211 Encounter for screening for malignant neoplasm of colon: Secondary | ICD-10-CM

## 2020-10-13 DIAGNOSIS — E7800 Pure hypercholesterolemia, unspecified: Secondary | ICD-10-CM

## 2020-10-13 DIAGNOSIS — Z13820 Encounter for screening for osteoporosis: Secondary | ICD-10-CM

## 2020-10-13 DIAGNOSIS — I10 Essential (primary) hypertension: Secondary | ICD-10-CM | POA: Diagnosis not present

## 2020-10-13 DIAGNOSIS — E785 Hyperlipidemia, unspecified: Secondary | ICD-10-CM

## 2020-10-13 DIAGNOSIS — R079 Chest pain, unspecified: Secondary | ICD-10-CM

## 2020-10-13 DIAGNOSIS — Z23 Encounter for immunization: Secondary | ICD-10-CM

## 2020-10-13 DIAGNOSIS — Z780 Asymptomatic menopausal state: Secondary | ICD-10-CM

## 2020-10-13 MED ORDER — ATORVASTATIN CALCIUM 40 MG PO TABS: 40.0000 mg | ORAL_TABLET | Freq: Every day | ORAL | 0 refills | Status: DC

## 2020-10-13 MED ORDER — METOPROLOL TARTRATE 25 MG PO TABS: 25.0000 mg | ORAL_TABLET | Freq: Two times a day (BID) | ORAL | 0 refills | Status: DC

## 2020-10-13 NOTE — Addendum Note (Signed)
Addended by: Kern Reap B on: 10/13/2020 02:31 PM   Modules accepted: Orders

## 2020-10-13 NOTE — Progress Notes (Signed)
Established Patient Office Visit     This visit occurred during the SARS-CoV-2 public health emergency.  Safety protocols were in place, including screening questions prior to the visit, additional usage of staff PPE, and extensive cleaning of exam room while observing appropriate contact time as indicated for disinfecting solutions.    CC/Reason for Visit: Follow-up chronic conditions  HPI: Kelly [name] is a 65 y.o. female who is coming in today for the above mentioned reasons. Past Medical History is significant for: Hypertension, hyperlipidemia and history of peripheral artery disease.  She has been lost to follow-up for over a year.  She is overdue for all preventive health care.  DEXA scan, colonoscopy, mammogram will be requested today.  She has not been taking her medications in over a year.  Now that she is on Medicare she will be more routine with her medical visits as previously she had not had insurance.  She has no acute concerns or complaints.  Requesting pneumonia vaccine.   Past Medical/Surgical History: Past Medical History:  Diagnosis Date   Blood in stool    SEES OCCASIONAL BLOOD IN STOOL....   Claudication (HCC)    Headache(784.0)    Hyperlipidemia    Hypertension    Tobacco abuse     Past Surgical History:  Procedure Laterality Date   ABDOMINAL AORTAGRAM N/A 05/30/2013   Procedure: ABDOMINAL Ronny Flurry;  Surgeon: Runell Gess, MD;  Location: [facility] CATH LAB;  Service: Cardiovascular;  Laterality: N/A;   ABDOMINAL HYSTERECTOMY     AORTA - BILATERAL FEMORAL ARTERY BYPASS GRAFT N/A 06/20/2013   Procedure: AORTA BIFEMORAL BYPASS GRAFT WITH REIMPLANTATION OF THE INFERIOR MESENTERIC ARTERY;  Surgeon: Nada Libman, MD;  Location: [facility] OR;  Service: Vascular;  Laterality: N/A;   BLADDER REPAIR W/ CESAREAN SECTION      Social History:  reports that she quit smoking about 7 years ago. Her smoking use included cigarettes. She has never used smokeless tobacco. She  reports current alcohol use of about 1.0 standard drink of alcohol per week. She reports that she does not use drugs.  Allergies: No Known Allergies  Family History:  Family History  Problem Relation Age of Onset   Hypertension Mother    Diabetes Mother    Hyperlipidemia Mother    Varicose Veins Mother    Heart disease Mother    Heart disease Father    Hypertension Father    Heart attack Father    Hyperlipidemia Sister    Hypertension Sister    Diabetes Brother    Hypertension Brother    Heart attack Brother    Heart disease Brother      Current Outpatient Medications:    nitroGLYCERIN (NITROSTAT) 0.4 MG SL tablet, Place 1 tablet (0.4 mg total) under the tongue every 5 (five) minutes as needed for chest pain., Disp: 90 tablet, Rfl: 3   aspirin EC 81 MG tablet, Take 81 mg by mouth daily as needed (for blood thinner).  (Patient not taking: Reported on 10/13/2020), Disp: , Rfl:    atorvastatin (LIPITOR) 40 MG tablet, Take 1 tablet (40 mg total) by mouth daily., Disp: 90 tablet, Rfl: 0   metoprolol tartrate (LOPRESSOR) 25 MG tablet, Take 1 tablet (25 mg total) by mouth 2 (two) times daily., Disp: 180 tablet, Rfl: 0  Review of Systems:  Constitutional: Denies fever, chills, diaphoresis, appetite change and fatigue.  HEENT: Denies photophobia, eye pain, redness, hearing loss, ear pain, congestion, sore throat, rhinorrhea, sneezing, mouth sores,  trouble swallowing, neck pain, neck stiffness and tinnitus.   Respiratory: Denies SOB, DOE, cough, chest tightness,  and wheezing.   Cardiovascular: Denies chest pain, palpitations and leg swelling.  Gastrointestinal: Denies nausea, vomiting, abdominal pain, diarrhea, constipation, blood in stool and abdominal distention.  Genitourinary: Denies dysuria, urgency, frequency, hematuria, flank pain and difficulty urinating.  Endocrine: Denies: hot or cold intolerance, sweats, changes in hair or nails, polyuria, polydipsia. Musculoskeletal: Denies  myalgias, back pain, joint swelling, arthralgias and gait problem.  Skin: Denies pallor, rash and wound.  Neurological: Denies dizziness, seizures, syncope, weakness, light-headedness, numbness and headaches.  Hematological: Denies adenopathy. Easy bruising, personal or family bleeding history  Psychiatric/Behavioral: Denies suicidal ideation, mood changes, confusion, nervousness, sleep disturbance and agitation    Physical Exam: Vitals:   10/13/20 1334  BP: 140/90  Pulse: 67  Temp: 98 F (36.7 C)  TempSrc: Oral  SpO2: 97%  Weight: 190 lb 1.6 oz (86.2 kg)  Height: 5\' 6"  (1.676 m)    Body mass index is 30.68 kg/m.   Constitutional: NAD, calm, comfortable Eyes: PERRL, lids and conjunctivae normal ENMT: Mucous membranes are moist.  Respiratory: clear to auscultation bilaterally, no wheezing, no crackles. Normal respiratory effort. No accessory muscle use.  Cardiovascular: Regular rate and rhythm, no murmurs / rubs / gallops. No extremity edema.  Neurologic: Grossly intact and nonfocal Psychiatric: Normal judgment and insight. Alert and oriented x 3. Normal mood.    Impression and Plan:  Colon cancer screening  - Plan: Ambulatory referral to Gastroenterology  Encounter for screening mammogram for malignant neoplasm of breast  - Plan: MM Digital Screening  Encounter for osteoporosis screening in asymptomatic postmenopausal patient  - Plan: DG Bone Density  Essential hypertension  - Plan: metoprolol tartrate (LOPRESSOR) 25 MG tablet -Not well controlled, she will resume medication and return in 3 months for follow-up.  Need for vaccination for Strep pneumoniae -PCV 20 administered today.  Pure hypercholesterolemia  - Plan: atorvastatin (LIPITOR) 40 MG tablet -Recheck lipids when she returns for physical.  Time spent: 32 minutes reviewing chart, interviewing and examining patient and formulating plan of care.   Patient Instructions  -Nice seeing you  today!!  -Resume aotrvastatin 40 mg daily and metoprolol 25 mg twice daily.  -Pneumonia vaccine today.  -Schedule follow up in 3 months for your physical.    , MD  Primary [facility] at [facility]

## 2020-10-13 NOTE — Patient Instructions (Signed)
-  Nice seeing you today!!  -Resume aotrvastatin 40 mg daily and metoprolol 25 mg twice daily.  -Pneumonia vaccine today.  -Schedule follow up in 3 months for your physical.

## 2020-10-14 ENCOUNTER — Telehealth: Payer: Self-pay | Admitting: Internal Medicine

## 2020-10-14 DIAGNOSIS — R072 Precordial pain: Secondary | ICD-10-CM

## 2020-10-14 MED ORDER — NITROGLYCERIN 0.4 MG SL SUBL: 0.4000 mg | SUBLINGUAL_TABLET | SUBLINGUAL | 3 refills | Status: AC | PRN

## 2020-10-14 NOTE — Telephone Encounter (Signed)
   Pt c/o medication issue:  1. Name of Medication:   aspirin EC 81 MG tablet    2. How are you currently taking this medication (dosage and times per day)? Take 81 mg by mouth daily as needed (for blood thinner).   3. Are you having a reaction (difficulty breathing--STAT)?   4. What is your medication issue?   [facility] calling, wanted to verify if pt needs to be on baby aspirin

## 2020-10-14 NOTE — Telephone Encounter (Signed)
   *  STAT* If patient is at the pharmacy, call can be transferred to refill team.   1. Which medications need to be refilled? (please list name of each medication and dose if known)   nitroGLYCERIN (NITROSTAT) 0.4 MG SL tablet     2. Which pharmacy/location (including street and city if local pharmacy) is medication to be sent to? [facility] - [place], [place] - [address] [facility].  3. Do they need a 30 day or 90 day supply? 1 bottle

## 2020-10-14 NOTE — Telephone Encounter (Signed)
The patient was wanting to know if she should still be taking the aspirin EC 81 MG tablet  Please advise

## 2020-10-14 NOTE — Telephone Encounter (Signed)
Called over to the pharmacy. Advised that last visit was telehealth visit in 2020- no follow up, no follow up testing was completed. Advised that she should seek approval from PCP, most recent visit was yesterday with her.  She verbalized understanding.

## 2020-10-15 NOTE — Telephone Encounter (Signed)
Patient is aware 

## 2020-12-14 ENCOUNTER — Ambulatory Visit: Payer: Medicare Other | Admitting: Physician Assistant

## 2021-01-12 ENCOUNTER — Other Ambulatory Visit: Payer: Self-pay

## 2021-01-13 ENCOUNTER — Other Ambulatory Visit (HOSPITAL_COMMUNITY)
Admission: RE | Admit: 2021-01-13 | Discharge: 2021-01-13 | Disposition: A | Discharge status: Home / Self Care | Payer: Medicare Other | Source: Ambulatory Visit | Attending: Internal Medicine | Admitting: Internal Medicine

## 2021-01-13 ENCOUNTER — Encounter: Payer: Self-pay | Admitting: Internal Medicine

## 2021-01-13 ENCOUNTER — Ambulatory Visit (INDEPENDENT_AMBULATORY_CARE_PROVIDER_SITE_OTHER): Payer: Medicare Other | Admitting: Internal Medicine

## 2021-01-13 VITALS — BP 132/80 | HR 72 | Temp 98.3°F | Ht 66.0 in | Wt 193.4 lb

## 2021-01-13 DIAGNOSIS — Z23 Encounter for immunization: Secondary | ICD-10-CM | POA: Diagnosis not present

## 2021-01-13 DIAGNOSIS — I10 Essential (primary) hypertension: Secondary | ICD-10-CM | POA: Diagnosis not present

## 2021-01-13 DIAGNOSIS — E782 Mixed hyperlipidemia: Secondary | ICD-10-CM

## 2021-01-13 DIAGNOSIS — Z1231 Encounter for screening mammogram for malignant neoplasm of breast: Secondary | ICD-10-CM

## 2021-01-13 DIAGNOSIS — Z01419 Encounter for gynecological examination (general) (routine) without abnormal findings: Secondary | ICD-10-CM | POA: Diagnosis present

## 2021-01-13 DIAGNOSIS — Z124 Encounter for screening for malignant neoplasm of cervix: Secondary | ICD-10-CM

## 2021-01-13 DIAGNOSIS — I739 Peripheral vascular disease, unspecified: Secondary | ICD-10-CM | POA: Diagnosis not present

## 2021-01-13 DIAGNOSIS — E2839 Other primary ovarian failure: Secondary | ICD-10-CM

## 2021-01-13 DIAGNOSIS — Z1151 Encounter for screening for human papillomavirus (HPV): Secondary | ICD-10-CM | POA: Diagnosis not present

## 2021-01-13 DIAGNOSIS — Z1211 Encounter for screening for malignant neoplasm of colon: Secondary | ICD-10-CM

## 2021-01-13 DIAGNOSIS — Z0000 Encounter for general adult medical examination without abnormal findings: Secondary | ICD-10-CM

## 2021-01-13 DIAGNOSIS — E7800 Pure hypercholesterolemia, unspecified: Secondary | ICD-10-CM

## 2021-01-13 LAB — CBC WITH DIFFERENTIAL/PLATELET
Basophils Absolute: 0 10*3/uL (ref 0.0–0.1)
Basophils Relative: 0.5 % (ref 0.0–3.0)
Eosinophils Absolute: 0.2 10*3/uL (ref 0.0–0.7)
Eosinophils Relative: 3.8 % (ref 0.0–5.0)
HCT: 39.6 % (ref 36.0–46.0)
Hemoglobin: 12.6 g/dL (ref 12.0–15.0)
Lymphocytes Relative: 33.3 % (ref 12.0–46.0)
Lymphs Abs: 2 10*3/uL (ref 0.7–4.0)
MCHC: 31.8 g/dL (ref 30.0–36.0)
MCV: 80.1 fl (ref 78.0–100.0)
Monocytes Absolute: 0.4 10*3/uL (ref 0.1–1.0)
Monocytes Relative: 6.1 % (ref 3.0–12.0)
Neutro Abs: 3.3 10*3/uL (ref 1.4–7.7)
Neutrophils Relative %: 56.3 % (ref 43.0–77.0)
Platelets: 274 10*3/uL (ref 150.0–400.0)
RBC: 4.95 Mil/uL (ref 3.87–5.11)
RDW: 15.5 % (ref 11.5–15.5)
WBC: 5.9 10*3/uL (ref 4.0–10.5)

## 2021-01-13 LAB — COMPREHENSIVE METABOLIC PANEL
ALT: 14 U/L (ref 0–35)
AST: 17 U/L (ref 0–37)
Albumin: 4.3 g/dL (ref 3.5–5.2)
Alkaline Phosphatase: 78 U/L (ref 39–117)
BUN: 10 mg/dL (ref 6–23)
CO2: 28 mEq/L (ref 19–32)
Calcium: 10 mg/dL (ref 8.4–10.5)
Chloride: 105 mEq/L (ref 96–112)
Creatinine, Ser: 0.84 mg/dL (ref 0.40–1.20)
GFR: 72.79 mL/min (ref >60.00)
Glucose, Bld: 94 mg/dL (ref 70–99)
Potassium: 3.8 mEq/L (ref 3.5–5.1)
Sodium: 140 mEq/L (ref 135–145)
Total Bilirubin: 0.6 mg/dL (ref 0.2–1.2)
Total Protein: 7.8 g/dL (ref 6.0–8.3)

## 2021-01-13 LAB — LIPID PANEL
Cholesterol: 161 mg/dL (ref 0–200)
HDL: 43.6 mg/dL (ref >39.00)
LDL Cholesterol: 92 mg/dL (ref 0–99)
NonHDL: 117.76
Total CHOL/HDL Ratio: 4
Triglycerides: 128 mg/dL (ref 0.0–149.0)
VLDL: 25.6 mg/dL (ref 0.0–40.0)

## 2021-01-13 MED ORDER — METOPROLOL TARTRATE 25 MG PO TABS: 25.0000 mg | ORAL_TABLET | Freq: Two times a day (BID) | ORAL | 1 refills | Status: DC

## 2021-01-13 MED ORDER — ATORVASTATIN CALCIUM 40 MG PO TABS: 40.0000 mg | ORAL_TABLET | Freq: Every day | ORAL | 1 refills | Status: DC

## 2021-01-13 NOTE — Addendum Note (Signed)
Addended by: Christy Sartorius on: 01/13/2021 05:09 PM   Modules accepted: Orders

## 2021-01-13 NOTE — Addendum Note (Signed)
Addended by: Marian Sorrow D on: 01/13/2021 10:49 AM   Modules accepted: Orders

## 2021-01-13 NOTE — Progress Notes (Signed)
Established Patient Office Visit     This visit occurred during the SARS-CoV-2 public health emergency.  Safety protocols were in place, including screening questions prior to the visit, additional usage of staff PPE, and extensive cleaning of exam room while observing appropriate contact time as indicated for disinfecting solutions.    CC/Reason for Visit: Initial Medicare wellness visit  HPI: Kelly [name] is a 65 y.o. female who is coming in today for the above mentioned reasons. Past Medical History is significant for: Hypertension, hyperlipidemia, peripheral artery disease.  Last month she had bilateral cataract surgery.  She has routine eye care, she does not visit the dentist as she has no natural teeth.  She has no perceived hearing issues reported overdue for flu, COVID booster, Tdap and shingles vaccinations.  She is overdue for DEXA scan and all cancer screening.  She has no acute concerns today.   Past Medical/Surgical History: Past Medical History:  Diagnosis Date   Blood in stool    SEES OCCASIONAL BLOOD IN STOOL....   Claudication (HCC)    Headache(784.0)    Hyperlipidemia    Hypertension    Tobacco abuse     Past Surgical History:  Procedure Laterality Date   ABDOMINAL AORTAGRAM N/A 05/30/2013   Procedure: ABDOMINAL Ronny Flurry;  Surgeon: Runell Gess, MD;  Location: [facility] CATH LAB;  Service: Cardiovascular;  Laterality: N/A;   ABDOMINAL HYSTERECTOMY     AORTA - BILATERAL FEMORAL ARTERY BYPASS GRAFT N/A 06/20/2013   Procedure: AORTA BIFEMORAL BYPASS GRAFT WITH REIMPLANTATION OF THE INFERIOR MESENTERIC ARTERY;  Surgeon: Nada Libman, MD;  Location: [facility] OR;  Service: Vascular;  Laterality: N/A;   BLADDER REPAIR W/ CESAREAN SECTION      Social History:  reports that she quit smoking about 7 years ago. Her smoking use included cigarettes. She has never used smokeless tobacco. She reports current alcohol use of about 1.0 standard drink per week. She reports that  she does not use drugs.  Allergies: No Known Allergies  Family History:  Family History  Problem Relation Age of Onset   Hypertension Mother    Diabetes Mother    Hyperlipidemia Mother    Varicose Veins Mother    Heart disease Mother    Heart disease Father    Hypertension Father    Heart attack Father    Hyperlipidemia Sister    Hypertension Sister    Diabetes Brother    Hypertension Brother    Heart attack Brother    Heart disease Brother      Current Outpatient Medications:    aspirin EC 81 MG tablet, Take 81 mg by mouth daily as needed (for blood thinner)., Disp: , Rfl:    BESIVANCE 0.6 % SUSP, Place 1 drop into the left eye 3 (three) times daily., Disp: , Rfl:    PRED FORTE 1 % ophthalmic suspension, Place 1 drop into the left eye 4 (four) times daily., Disp: , Rfl:    PROLENSA 0.07 % SOLN, Place 1 drop into the left eye at bedtime., Disp: , Rfl:    atorvastatin (LIPITOR) 40 MG tablet, Take 1 tablet (40 mg total) by mouth daily., Disp: 90 tablet, Rfl: 1   metoprolol tartrate (LOPRESSOR) 25 MG tablet, Take 1 tablet (25 mg total) by mouth 2 (two) times daily., Disp: 180 tablet, Rfl: 1   nitroGLYCERIN (NITROSTAT) 0.4 MG SL tablet, Place 1 tablet (0.4 mg total) under the tongue every 5 (five) minutes as needed for chest pain.,  Disp: 45 tablet, Rfl: 3  Review of Systems:  Constitutional: Denies fever, chills, diaphoresis, appetite change and fatigue.  HEENT: Denies photophobia, eye pain, redness, hearing loss, ear pain, congestion, sore throat, rhinorrhea, sneezing, mouth sores, trouble swallowing, neck pain, neck stiffness and tinnitus.   Respiratory: Denies SOB, DOE, cough, chest tightness,  and wheezing.   Cardiovascular: Denies chest pain, palpitations and leg swelling.  Gastrointestinal: Denies nausea, vomiting, abdominal pain, diarrhea, constipation, blood in stool and abdominal distention.  Genitourinary: Denies dysuria, urgency, frequency, hematuria, flank pain and  difficulty urinating.  Endocrine: Denies: hot or cold intolerance, sweats, changes in hair or nails, polyuria, polydipsia. Musculoskeletal: Denies myalgias, back pain, joint swelling, arthralgias and gait problem.  Skin: Denies pallor, rash and wound.  Neurological: Denies dizziness, seizures, syncope, weakness, light-headedness, numbness and headaches.  Hematological: Denies adenopathy. Easy bruising, personal or family bleeding history  Psychiatric/Behavioral: Denies suicidal ideation, mood changes, confusion, nervousness, sleep disturbance and agitation    Physical Exam: Vitals:   01/13/21 0950  BP: 132/80  Pulse: 72  Temp: 98.3 F (36.8 C)  TempSrc: Oral  SpO2: 97%  Weight: 193 lb 6.4 oz (87.7 kg)  Height: 5\' 6"  (1.676 m)    Body mass index is 31.22 kg/m.   Constitutional: NAD, calm, comfortable Eyes: PERRL, lids and conjunctivae normal ENMT: Mucous membranes are moist. Posterior pharynx clear of any exudate or lesions. Normal dentition. Tympanic membrane is pearly white, no erythema or bulging. Neck: normal, supple, no masses, no thyromegaly Respiratory: clear to auscultation bilaterally, no wheezing, no crackles. Normal respiratory effort. No accessory muscle use.  Cardiovascular: Regular rate and rhythm, no murmurs / rubs / gallops. No extremity edema. 2+ pedal pulses. No carotid bruits.  Abdomen: no tenderness, no masses palpated. No hepatosplenomegaly. Bowel sounds positive.  Musculoskeletal: no clubbing / cyanosis. No joint deformity upper and lower extremities. Good ROM, no contractures. Normal muscle tone.  Skin: no rashes, lesions, ulcers. No induration Neurologic: CN 2-12 grossly intact. Sensation intact, DTR normal. Strength 5/5 in all 4.  Psychiatric: Normal judgment and insight. Alert and oriented x 3. Normal mood.    Subsequent Medicare wellness visit   1. Risk factors, based on past  M,S,F -cardiovascular disease risk factors include age, history of  hypertension and hyperlipidemia as well as peripheral artery disease   2.  Physical activities: She is very sedentary, we have talked about increasing physical activity   3.  Depression/mood: Stable, not depressed   4.  Hearing: No perceived issues   5.  ADL's: Independent in all ADLs   6.  Fall risk: Low fall risk   7.  Home safety: No problems identified   8.  Height weight, and visual acuity: height and weight as above, vision: 20/32 with her left eye, 20/40 with her right eye and 20/25 with eyes together   9.  Counseling: Advised to update vaccination status as well as cancer screening   10. Lab orders based on risk factors: Laboratory update will be reviewed   11. Referral : GI for colonoscopy   12. Care plan: Follow-up with me in 6 months   13. Cognitive assessment: No cognitive impairment   14. Screening: Patient provided with a written and personalized 5-10 year screening schedule in the AVS. yes   15. Provider List Update: PCP, ophthalmologist  16. Advance Directives: Full code   17. Opioids: Patient is not on any opioid prescriptions and has no risk factors for a substance use disorder.   Flowsheet Row  Office Visit from 10/13/2020 in [facility] [place]  PHQ-9 Total Score 5       Fall Risk 08/16/2018 10/13/2020  Falls in the past year? 0 1  Was there an injury with Fall? 0 0  Fall Risk Category Calculator 0 1  Fall Risk Category Low Low     Impression and Plan:  Essential hypertension  - Plan: CBC with Differential/Platelet, Comprehensive metabolic panel, metoprolol tartrate (LOPRESSOR) 25 MG tablet -Fair control, refill metoprolol today, recheck blood pressure next visit, she has also been advised to do ambulatory blood pressure monitoring.  Initial Medicare annual wellness visit -Recommend routine eye and dental care. -Immunizations: Flu vaccine today, pneumonia status is up-to-date, she has been advised to get COVID booster, shingles  and Tdap at pharmacy. -Healthy lifestyle discussed in detail. -Labs to be updated today. -Colon cancer screening: Referral to GI for initial screening colonoscopy that she is well overdue for -Breast cancer screening: Referral for mammogram today -Cervical cancer screening: Pap smear performed in office today. -Lung cancer screening: Not applicable -Prostate cancer screening: Not applicable -DEXA: Sent for DEXA scan today.  Peripheral vascular disease, unspecified (HCC) -Check lipids, for her blood pressure management.  Encounter for Papanicolaou smear for cervical cancer screening  - Plan: PAP [[place]] -Pap done in office today.  Pure hypercholesterolemia  - Plan: atorvastatin (LIPITOR) 40 MG tablet -Check lipids today  Need for influenza vaccination -Flu vaccine administered today    Patient Instructions  -Nice seeing you today!!  -Lab work today; will notify you once results are available.  -Flu vaccine today.  -Schedule follow up in 6 months.  -Remember your tetanus, shingles and COVID boosters at the pharmacy.  -Referral for mammogram, colonoscopy and bone density scan today.   Chaya Jan, MD [facility]

## 2021-01-13 NOTE — Patient Instructions (Signed)
-  Nice seeing you today!!  -Lab work today; will notify you once results are available.  -Flu vaccine today.  -Schedule follow up in 6 months.  -Remember your tetanus, shingles and COVID boosters at the pharmacy.  -Referral for mammogram, colonoscopy and bone density scan today.

## 2021-01-14 LAB — CYTOLOGY - PAP
Adequacy: ABSENT
Comment: NEGATIVE
Diagnosis: NEGATIVE
High risk HPV: NEGATIVE

## 2021-03-21 HISTORY — PX: EYE SURGERY: SHX253

## 2021-04-13 ENCOUNTER — Other Ambulatory Visit: Payer: Self-pay | Admitting: Internal Medicine

## 2021-04-13 DIAGNOSIS — I10 Essential (primary) hypertension: Secondary | ICD-10-CM

## 2021-04-13 DIAGNOSIS — E7800 Pure hypercholesterolemia, unspecified: Secondary | ICD-10-CM

## 2021-10-11 ENCOUNTER — Other Ambulatory Visit: Payer: Self-pay | Admitting: Internal Medicine

## 2021-10-11 DIAGNOSIS — E7800 Pure hypercholesterolemia, unspecified: Secondary | ICD-10-CM

## 2021-10-11 DIAGNOSIS — I10 Essential (primary) hypertension: Secondary | ICD-10-CM

## 2021-10-18 ENCOUNTER — Encounter: Payer: Self-pay | Admitting: Internal Medicine

## 2021-10-18 ENCOUNTER — Ambulatory Visit (INDEPENDENT_AMBULATORY_CARE_PROVIDER_SITE_OTHER): Payer: Medicare Other | Admitting: Internal Medicine

## 2021-10-18 VITALS — BP 130/80 | HR 62 | Temp 97.7°F | Ht 66.0 in | Wt 188.0 lb

## 2021-10-18 DIAGNOSIS — M545 Low back pain, unspecified: Secondary | ICD-10-CM | POA: Diagnosis not present

## 2021-10-18 LAB — POCT URINALYSIS DIPSTICK
Bilirubin, UA: NEGATIVE
Blood, UA: NEGATIVE
Glucose, UA: NEGATIVE
Ketones, UA: NEGATIVE
Leukocytes, UA: NEGATIVE
Nitrite, UA: NEGATIVE
Protein, UA: NEGATIVE
Spec Grav, UA: 1.01 (ref 1.010–1.025)
Urobilinogen, UA: 0.2 E.U./dL
pH, UA: 6 (ref 5.0–8.0)

## 2021-10-18 MED ORDER — PREDNISONE 10 MG (21) PO TBPK: ORAL_TABLET | ORAL | 0 refills | Status: DC

## 2021-10-18 NOTE — Progress Notes (Signed)
Established Patient Office Visit     CC/Reason for Visit: Acute low back pain  HPI: Kelly [name] is a 66 y.o. female who is coming in today for the above mentioned reasons.  For the past 2 days she has been having intense, achy low back pain that is in the midline right at her sacral area.  There is no radiation although she will state that at times she has some cramping in both of her calves.  This pain will usually come on at nighttime, is not related to activity.  She denies numbness or tingling down her legs.  She does not have any urinary symptoms although may be some increased urinary frequency but she attributes this to drinking excess water.  No injuries that she can recall.  Past Medical/Surgical History: Past Medical History:  Diagnosis Date   Blood in stool    SEES OCCASIONAL BLOOD IN STOOL....   Claudication (HCC)    Headache(784.0)    Hyperlipidemia    Hypertension    Tobacco abuse     Past Surgical History:  Procedure Laterality Date   ABDOMINAL AORTAGRAM N/A 05/30/2013   Procedure: ABDOMINAL Ronny Flurry;  Surgeon: Runell Gess, MD;  Location: [facility] CATH LAB;  Service: Cardiovascular;  Laterality: N/A;   ABDOMINAL HYSTERECTOMY     AORTA - BILATERAL FEMORAL ARTERY BYPASS GRAFT N/A 06/20/2013   Procedure: AORTA BIFEMORAL BYPASS GRAFT WITH REIMPLANTATION OF THE INFERIOR MESENTERIC ARTERY;  Surgeon: Nada Libman, MD;  Location: [facility] OR;  Service: Vascular;  Laterality: N/A;   BLADDER REPAIR W/ CESAREAN SECTION      Social History:  reports that she quit smoking about 8 years ago. Her smoking use included cigarettes. She has never used smokeless tobacco. She reports current alcohol use of about 1.0 standard drink of alcohol per week. She reports that she does not use drugs.  Allergies: No Known Allergies  Family History:  Family History  Problem Relation Age of Onset   Hypertension Mother    Diabetes Mother    Hyperlipidemia Mother    Varicose Veins Mother     Heart disease Mother    Heart disease Father    Hypertension Father    Heart attack Father    Hyperlipidemia Sister    Hypertension Sister    Diabetes Brother    Hypertension Brother    Heart attack Brother    Heart disease Brother      Current Outpatient Medications:    aspirin EC 81 MG tablet, Take 81 mg by mouth daily as needed (for blood thinner)., Disp: , Rfl:    atorvastatin (LIPITOR) 40 MG tablet, TAKE 1 Tablet BY MOUTH ONCE DAILY, Disp: 90 tablet, Rfl: 1   metoprolol tartrate (LOPRESSOR) 25 MG tablet, TAKE 1 Tablet BY MOUTH TWICE DAILY, Disp: 180 tablet, Rfl: 1   PRED FORTE 1 % ophthalmic suspension, Place 1 drop into the left eye 4 (four) times daily., Disp: , Rfl:    predniSONE (STERAPRED UNI-PAK 21 TAB) 10 MG (21) TBPK tablet, Take as directed, Disp: 21 tablet, Rfl: 0   nitroGLYCERIN (NITROSTAT) 0.4 MG SL tablet, Place 1 tablet (0.4 mg total) under the tongue every 5 (five) minutes as needed for chest pain., Disp: 45 tablet, Rfl: 3  Review of Systems:  Constitutional: Denies fever, chills, diaphoresis, appetite change and fatigue.  HEENT: Denies photophobia, eye pain, redness, hearing loss, ear pain, congestion, sore throat, rhinorrhea, sneezing, mouth sores, trouble swallowing, neck pain, neck stiffness and tinnitus.  Respiratory: Denies SOB, DOE, cough, chest tightness,  and wheezing.   Cardiovascular: Denies chest pain, palpitations and leg swelling.  Gastrointestinal: Denies nausea, vomiting, abdominal pain, diarrhea, constipation, blood in stool and abdominal distention.  Genitourinary: Denies dysuria, urgency, frequency, hematuria, flank pain and difficulty urinating.  Endocrine: Denies: hot or cold intolerance, sweats, changes in hair or nails, polyuria, polydipsia. Musculoskeletal: Denies myalgias, back pain, joint swelling, arthralgias and gait problem.  Skin: Denies pallor, rash and wound.  Neurological: Denies dizziness, seizures, syncope, weakness,  light-headedness, numbness and headaches.  Hematological: Denies adenopathy. Easy bruising, personal or family bleeding history  Psychiatric/Behavioral: Denies suicidal ideation, mood changes, confusion, nervousness, sleep disturbance and agitation    Physical Exam: Vitals:   10/18/21 1525 10/18/21 1528 10/18/21 1552  BP: (!) 150/100 (!) 150/100 130/80  Pulse: 62    Temp: 97.7 F (36.5 C)    TempSrc: Oral    SpO2: 97%    Weight: 188 lb (85.3 kg)    Height: 5\' 6"  (1.676 m)      Body mass index is 30.34 kg/m.   Constitutional: NAD, calm, comfortable Eyes: PERRL, lids and conjunctivae normal ENMT: Mucous membranes are moist.  Abdomen: no tenderness, no masses palpated. No hepatosplenomegaly. Bowel sounds positive.  Musculoskeletal: no clubbing / cyanosis. No joint deformity upper and lower extremities. Good ROM, no contractures. Normal muscle tone.   Psychiatric: Normal judgment and insight. Alert and oriented x 3. Normal mood.    Impression and Plan:  Acute midline low back pain without sciatica  - Plan: POC Urinalysis Dipstick, Ambulatory referral to Physical Therapy, predniSONE (STERAPRED UNI-PAK 21 TAB) 10 MG (21) TBPK tablet -In office urine dipstick is negative. -Suspect musculoskeletal. -Will send in some prednisone and arrange for her to have physical therapy. -Have also advised that she follow-up with her vascular surgeon in regards to her PAD as she has not seen them since 2018.    Time spent:30 minutes reviewing chart, interviewing and examining patient and formulating plan of care.    2019, MD [facility]

## 2021-11-03 ENCOUNTER — Ambulatory Visit: Payer: Medicare Other | Admitting: Rehabilitative and Restorative Service Providers"

## 2022-02-23 DIAGNOSIS — Z0000 Encounter for general adult medical examination without abnormal findings: Secondary | ICD-10-CM | POA: Diagnosis not present

## 2022-02-23 DIAGNOSIS — I25708 Atherosclerosis of coronary artery bypass graft(s), unspecified, with other forms of angina pectoris: Secondary | ICD-10-CM | POA: Diagnosis not present

## 2022-02-23 DIAGNOSIS — Z1159 Encounter for screening for other viral diseases: Secondary | ICD-10-CM | POA: Diagnosis not present

## 2022-02-23 DIAGNOSIS — Z87891 Personal history of nicotine dependence: Secondary | ICD-10-CM | POA: Diagnosis not present

## 2022-02-23 DIAGNOSIS — E785 Hyperlipidemia, unspecified: Secondary | ICD-10-CM | POA: Diagnosis not present

## 2022-02-23 DIAGNOSIS — I10 Essential (primary) hypertension: Secondary | ICD-10-CM | POA: Diagnosis not present

## 2022-02-23 DIAGNOSIS — Z136 Encounter for screening for cardiovascular disorders: Secondary | ICD-10-CM | POA: Diagnosis not present

## 2022-02-23 DIAGNOSIS — Z79899 Other long term (current) drug therapy: Secondary | ICD-10-CM | POA: Diagnosis not present

## 2022-03-16 ENCOUNTER — Other Ambulatory Visit: Payer: Self-pay | Admitting: Internal Medicine

## 2022-03-16 DIAGNOSIS — I739 Peripheral vascular disease, unspecified: Secondary | ICD-10-CM | POA: Diagnosis not present

## 2022-03-16 DIAGNOSIS — Z23 Encounter for immunization: Secondary | ICD-10-CM | POA: Diagnosis not present

## 2022-03-16 DIAGNOSIS — I10 Essential (primary) hypertension: Secondary | ICD-10-CM

## 2022-03-16 DIAGNOSIS — E785 Hyperlipidemia, unspecified: Secondary | ICD-10-CM | POA: Diagnosis not present

## 2022-03-16 DIAGNOSIS — E7800 Pure hypercholesterolemia, unspecified: Secondary | ICD-10-CM

## 2022-03-17 ENCOUNTER — Other Ambulatory Visit: Payer: Self-pay | Admitting: Family Medicine

## 2022-03-17 DIAGNOSIS — E2839 Other primary ovarian failure: Secondary | ICD-10-CM

## 2022-03-17 DIAGNOSIS — Z1231 Encounter for screening mammogram for malignant neoplasm of breast: Secondary | ICD-10-CM

## 2022-06-24 ENCOUNTER — Other Ambulatory Visit: Payer: Self-pay | Admitting: Family Medicine

## 2022-06-24 DIAGNOSIS — R1900 Intra-abdominal and pelvic swelling, mass and lump, unspecified site: Secondary | ICD-10-CM

## 2022-07-13 ENCOUNTER — Other Ambulatory Visit: Payer: Self-pay | Admitting: Internal Medicine

## 2022-07-13 DIAGNOSIS — E7800 Pure hypercholesterolemia, unspecified: Secondary | ICD-10-CM

## 2022-07-13 DIAGNOSIS — I10 Essential (primary) hypertension: Secondary | ICD-10-CM

## 2022-07-15 ENCOUNTER — Ambulatory Visit
Admission: RE | Admit: 2022-07-15 | Discharge: 2022-07-15 | Disposition: A | Discharge status: Home / Self Care | Payer: 59 | Source: Ambulatory Visit | Attending: Family Medicine | Admitting: Family Medicine

## 2022-07-15 DIAGNOSIS — R1900 Intra-abdominal and pelvic swelling, mass and lump, unspecified site: Secondary | ICD-10-CM

## 2022-08-01 ENCOUNTER — Other Ambulatory Visit: Payer: Medicare Other

## 2022-09-01 NOTE — Progress Notes (Signed)
Cardiology Clinic Note   Patient Name: Kelly [name] Date of Encounter: 09/09/2022  Primary Care Provider:  Philip Aspen, Limmie Patricia, MD Primary Cardiologist:  Parke Poisson, MD  Patient Profile    67 year old female patient with history of hypertension, PAD with history of aortic bifemoral bypass due to bilateral leg claudication in 2015, hyperlipidemia, and chest discomfort with associated dyspnea on exertion.  Has not been seen in the office since 2020.  She was ordered a nuclear medicine stress test and echocardiogram which has not been completed at time of this office visit.  Of note most recent nuclear medicine stress test was in 2015 and was negative for ischemia.   Past Medical History    Past Medical History:  Diagnosis Date   Blood in stool    SEES OCCASIONAL BLOOD IN STOOL....   Claudication (HCC)    Headache(784.0)    Hyperlipidemia    Hypertension    Tobacco abuse    Past Surgical History:  Procedure Laterality Date   ABDOMINAL AORTAGRAM N/A 05/30/2013   Procedure: ABDOMINAL Ronny Flurry;  Surgeon: Runell Gess, MD;  Location: [facility] CATH LAB;  Service: Cardiovascular;  Laterality: N/A;   ABDOMINAL HYSTERECTOMY     AORTA - BILATERAL FEMORAL ARTERY BYPASS GRAFT N/A 06/20/2013   Procedure: AORTA BIFEMORAL BYPASS GRAFT WITH REIMPLANTATION OF THE INFERIOR MESENTERIC ARTERY;  Surgeon: Nada Libman, MD;  Location: [facility] OR;  Service: Vascular;  Laterality: N/A;   BLADDER REPAIR W/ CESAREAN SECTION      Allergies  No Known Allergies  History of Present Illness    Kelly [name] comes today for ongoing assessment and management of hypertension, hyperlipidemia, history of PAD status post post aortic fem bypass in 2015.  She is scheduled to have abdominal hernia repair through Dr. Carolynne Edouard with [facility] surgery on date to be determined.  They are requesting preoperative cardiac evaluation prior to proceeding.  She comes with her daughter who is helpful in providing  history.  The patient continues to have issues with hypertension.  Mostly related to abdominal pain deconditioning and obesity.  She is not very active.  She is due to follow-up with vein and vascular for their preoperative evaluation as well.  She is due for ABIs within the next few weeks, she has worsening complaints of left leg pain with ambulation indicative of claudication..  She is very anxious about any upcoming surgeries or testing.  She is being followed by Dr. Sherryll Burger, [facility] health for labs and management of hypertension.  Home Medications    Current Outpatient Medications  Medication Sig Dispense Refill   atorvastatin (LIPITOR) 40 MG tablet TAKE 1 Tablet BY MOUTH ONCE DAILY 90 tablet 0   metoprolol tartrate (LOPRESSOR) 25 MG tablet TAKE 1 Tablet BY MOUTH TWICE DAILY 180 tablet 0   valsartan (DIOVAN) 40 MG tablet Take 40 mg by mouth daily.     aspirin EC 81 MG tablet Take 81 mg by mouth daily as needed (for blood thinner). (Patient not taking: Reported on 09/09/2022)     nitroGLYCERIN (NITROSTAT) 0.4 MG SL tablet Place 1 tablet (0.4 mg total) under the tongue every 5 (five) minutes as needed for chest pain. (Patient not taking: Reported on 09/09/2022) 45 tablet 3   PRED FORTE 1 % ophthalmic suspension Place 1 drop into the left eye 4 (four) times daily. (Patient not taking: Reported on 09/09/2022)     predniSONE (STERAPRED UNI-PAK 21 TAB) 10 MG (21) TBPK tablet Take as directed (Patient  not taking: Reported on 09/09/2022) 21 tablet 0   No current facility-administered medications for this visit.     Family History    Family History  Problem Relation Age of Onset   Hypertension Mother    Diabetes Mother    Hyperlipidemia Mother    Varicose Veins Mother    Heart disease Mother    Heart disease Father    Hypertension Father    Heart attack Father    Hyperlipidemia Sister    Hypertension Sister    Diabetes Brother    Hypertension Brother    Heart attack Brother    Heart  disease Brother    She indicated that her mother is deceased. She indicated that her father is deceased. She indicated that the status of her sister is unknown. She indicated that the status of her brother is unknown.  Social History    Social History   Socioeconomic History   Marital status: Widowed    Spouse name: Not on file   Number of children: Not on file   Years of education: Not on file   Highest education level: Not on file  Occupational History   Not on file  Tobacco Use   Smoking status: Former    Types: Cigarettes    Quit date: 06/12/2013    Years since quitting: 9.2   Smokeless tobacco: Never   Tobacco comments:    states she smokes 0-1 cig per day  Vaping Use   Vaping Use: Never used  Substance and Sexual Activity   Alcohol use: Yes    Alcohol/week: 1.0 standard drink of alcohol    Types: 1 Cans of beer per week   Drug use: No   Sexual activity: Not on file  Other Topics Concern   Not on file  Social History Narrative   Not on file   Social Determinants of Health   Financial Resource Strain: Not on file  Food Insecurity: Not on file  Transportation Needs: Not on file  Physical Activity: Not on file  Stress: Not on file  Social Connections: Not on file  Intimate Partner Violence: Not on file     Review of Systems    General:  No chills, fever, night sweats or weight changes.  Cardiovascular:  No chest pain, positive for dyspnea on exertion, no edema, orthopnea, palpitations, paroxysmal nocturnal dyspnea. Dermatological: No rash, lesions/masses Respiratory: No cough, dyspnea Urologic: No hematuria, dysuria Abdominal:   No nausea, vomiting, diarrhea, bright red blood per rectum, melena, or hematemesis Neurologic:  No visual changes, wkns, changes in mental status. All other systems reviewed and are otherwise negative except as noted above.     Physical Exam    VS:  BP (!) 144/90 (BP Location: Left Arm, Patient Position: Sitting, Cuff Size:  Large)   Pulse (!) 56   Ht 5\' 6"  (1.676 m)   Wt 193 lb 9.6 oz (87.8 kg)   SpO2 96%   BMI 31.25 kg/m  , BMI Body mass index is 31.25 kg/m.     GEN: Well nourished, well developed, in no acute distress.  Anxious HEENT: normal. Neck: Supple, no JVD, carotid bruits, or masses. Cardiac: RRR, no murmurs, rubs, or gallops. No clubbing, cyanosis, edema.  Radials/DP/PT 1+ on the left, diminished on the right. Respiratory:  Respirations regular and unlabored, clear to auscultation bilaterally. GI: Soft, nontender, nondistended, BS + x 4. MS: no deformity or atrophy. Skin: warm and dry, no rash. Neuro:  Strength and sensation are intact.  Psych: Normal affect.  Accessory Clinical Findings    ECG personally reviewed by me today-sinus bradycardia with T wave inversion inferior laterally heart rate 56 bpm  Lab Results  Component Value Date   WBC 5.9 01/13/2021   HGB 12.6 01/13/2021   HCT 39.6 01/13/2021   MCV 80.1 01/13/2021   PLT 274.0 01/13/2021   Lab Results  Component Value Date   CREATININE 0.84 01/13/2021   BUN 10 01/13/2021   NA 140 01/13/2021   K 3.8 01/13/2021   CL 105 01/13/2021   CO2 28 01/13/2021   Lab Results  Component Value Date   ALT 14 01/13/2021   AST 17 01/13/2021   ALKPHOS 78 01/13/2021   BILITOT 0.6 01/13/2021   Lab Results  Component Value Date   CHOL 161 01/13/2021   HDL 43.60 01/13/2021   LDLCALC 92 01/13/2021   TRIG 128.0 01/13/2021   CHOLHDL 4 01/13/2021    No results found for: "HGBA1C"  Review of Prior Studies: Please see scanned documents as most recent was in 2015  Assessment & Plan   1.  Hypertension: Blood pressure is elevated today, retook it in the office and decreased to 138/68.  She is bradycardic on metoprolol.  Continue current medication management.  Will repeat echocardiogram for changes in LV function with ongoing hypertension treatment.  2.  Abnormal EKG: New T wave inversions noted anterior laterally.  Will complete a  coronary artery CTA for evaluation of coronary artery disease in the setting of known peripheral arterial disease and hypercholesterolemia.  She is to have hernia surgery once cleared by cardiology but since she has not been seen in 2 years and has had no prior cardiac workup with changes in EKG I feel that this is warranted.  3.  Hypercholesterolemia: Remains on atorvastatin 40 mg daily.  Goal of LDL less than 70 in the setting of PAD.  Primary care provider at [facility] is managing labs.  4.  PAD: Status post aortofemoral bypass.  Followed by Dr. Myra Gianotti.  He will weigh in concerning preoperative evaluation and clearance prior to hernia repair.        Signed, Bettey Mare. Liborio Nixon, ANP, AACC   09/09/2022 2:58 PM      Office [phone] Fax [phone]  Notice: This dictation was prepared with Dragon dictation along with smaller phrase technology. Any transcriptional errors that result from this process are unintentional and may not be corrected upon review.

## 2022-09-08 ENCOUNTER — Other Ambulatory Visit: Payer: Self-pay | Admitting: *Deleted

## 2022-09-08 DIAGNOSIS — I739 Peripheral vascular disease, unspecified: Secondary | ICD-10-CM

## 2022-09-09 ENCOUNTER — Encounter: Payer: Self-pay | Admitting: Adult Health

## 2022-09-09 ENCOUNTER — Ambulatory Visit: Payer: 59 | Attending: Adult Health | Admitting: Adult Health

## 2022-09-09 VITALS — BP 144/90 | HR 56 | Ht 66.0 in | Wt 193.6 lb

## 2022-09-09 DIAGNOSIS — I10 Essential (primary) hypertension: Secondary | ICD-10-CM

## 2022-09-09 DIAGNOSIS — R9431 Abnormal electrocardiogram [ECG] [EKG]: Secondary | ICD-10-CM | POA: Diagnosis not present

## 2022-09-09 DIAGNOSIS — E7800 Pure hypercholesterolemia, unspecified: Secondary | ICD-10-CM

## 2022-09-09 DIAGNOSIS — I739 Peripheral vascular disease, unspecified: Secondary | ICD-10-CM

## 2022-09-09 DIAGNOSIS — I350 Nonrheumatic aortic (valve) stenosis: Secondary | ICD-10-CM

## 2022-09-09 NOTE — Patient Instructions (Signed)
Medication Instructions:  No Changes *If you need a refill on your cardiac medications before your next appointment, please call your pharmacy*   Lab Work: BMET ; Today If you have labs (blood work) drawn today and your tests are completely normal, you will receive your results only by: MyChart Message (if you have MyChart) OR A paper copy in the mail If you have any lab test that is abnormal or we need to change your treatment, we will call you to review the results.   Testing/Procedures: [address], [address]. Your physician has requested that you have an echocardiogram. Echocardiography is a painless test that uses sound waves to create images of your heart. It provides your doctor with information about the size and shape of your heart and how well your heart's chambers and valves are working. This procedure takes approximately one hour. There are no restrictions for this procedure. Please do NOT wear cologne, perfume, aftershave, or lotions (deodorant is allowed). Please arrive 15 minutes prior to your appointment time.     Your cardiac CT will be scheduled at one of the below locations:   [facility] [address] [place], [place] [address] [phone]   If scheduled at [facility], please arrive at the [facility] Entrance (Entrance C2) of [facility] 30 minutes prior to test start time. You can use the FREE valet parking offered at entrance C (encouraged to control the heart rate for the test)  Proceed to the [facility] Radiology Department (first floor) to check-in and test prep.  All radiology patients and guests should use entrance C2 at [facility], accessed from [facility], even though the hospital's physical address listed is [address]     On the Night Before the Test: Be sure to Drink plenty of water. Do not consume any caffeinated/decaffeinated beverages or chocolate 12  hours prior to your test. Do not take any antihistamines 12 hours prior to your test.  On the Day of the Test: Drink plenty of water until 1 hour prior to the test. Do not eat any food 1 hour prior to test. You may take your regular medications prior to the test.  Take metoprolol (Lopressor) two hours prior to test. If you take Furosemide/Hydrochlorothiazide/Spironolactone, please HOLD on the morning of the test. FEMALES- please wear underwire-free bra if available, avoid dresses & tight clothing       After the Test: Drink plenty of water. After receiving IV contrast, you may experience a mild flushed feeling. This is normal. On occasion, you may experience a mild rash up to 24 hours after the test. This is not dangerous. If this occurs, you can take Benadryl 25 mg and increase your fluid intake. If you experience trouble breathing, this can be serious. If it is severe call 911 IMMEDIATELY. If it is mild, please call our office. If you take any of these medications: Glipizide/Metformin, Avandament, Glucavance, please do not take 48 hours after completing test unless otherwise instructed.  We will call to schedule your test 2-4 weeks out understanding that some insurance companies will need an authorization prior to the service being performed.   For more information and frequently asked questions, please visit our website : [email or web address]  For non-scheduling related questions, please contact the cardiac imaging nurse navigator should you have any questions/concerns: Kelly [name], Cardiac Imaging Nurse Navigator Kelly [name], Cardiac Imaging Nurse Navigator [place] [facility] Direct  Office Dial: [phone]   For scheduling needs, including cancellations and rescheduling, please call [place], [phone].    To learn more about what you can do with MyChart, go to [email or web address].    Your next appointment:   6-8week(s)  Provider:    Joni Reining, DNP, ANP    Then, Kelly Poisson, MD will plan to see you again in 2-3 month(s).

## 2022-09-10 LAB — BASIC METABOLIC PANEL
BUN/Creatinine Ratio: 7 — ABNORMAL LOW (ref 12–28)
BUN: 6 mg/dL — ABNORMAL LOW (ref 8–27)
CO2: 20 mmol/L (ref 20–29)
Calcium: 10.2 mg/dL (ref 8.7–10.3)
Chloride: 105 mmol/L (ref 96–106)
Creatinine, Ser: 0.83 mg/dL (ref 0.57–1.00)
Glucose: 99 mg/dL (ref 70–99)
Potassium: 4.7 mmol/L (ref 3.5–5.2)
Sodium: 141 mmol/L (ref 134–144)
eGFR: 77 mL/min/{1.73_m2} (ref >59)

## 2022-09-12 ENCOUNTER — Telehealth: Payer: Self-pay

## 2022-09-12 NOTE — Telephone Encounter (Addendum)
Called patient regarding results. Patient has understanding of results.----- Message from Jodelle Gross, NP sent at 09/12/2022  7:04 AM EDT ----- I have reviewed the labs.  Essentially normal levels except BUN indicating that she needs to increase the protein in her diet. No changes to her treatment regimen.

## 2022-09-15 ENCOUNTER — Ambulatory Visit
Admission: RE | Admit: 2022-09-15 | Discharge: 2022-09-15 | Disposition: A | Discharge status: Home / Self Care | Payer: 59 | Source: Ambulatory Visit | Attending: Family Medicine | Admitting: Family Medicine

## 2022-09-15 ENCOUNTER — Encounter: Payer: Self-pay | Admitting: *Deleted

## 2022-09-15 ENCOUNTER — Ambulatory Visit
Admission: RE | Admit: 2022-09-15 | Discharge: 2022-09-15 | Disposition: A | Discharge status: Home / Self Care | Payer: Medicaid Other | Source: Ambulatory Visit | Attending: Family Medicine | Admitting: Family Medicine

## 2022-09-15 DIAGNOSIS — E2839 Other primary ovarian failure: Secondary | ICD-10-CM

## 2022-09-15 DIAGNOSIS — Z1231 Encounter for screening mammogram for malignant neoplasm of breast: Secondary | ICD-10-CM

## 2022-09-16 ENCOUNTER — Telehealth (HOSPITAL_COMMUNITY): Payer: Self-pay | Admitting: *Deleted

## 2022-09-16 NOTE — Telephone Encounter (Signed)
Attempted to call patient regarding upcoming cardiac CT appointment. Left message on voicemail with name and callback number Masaji Billups RN Navigator Cardiac Imaging [place] [facility] [phone] Office  

## 2022-09-19 ENCOUNTER — Ambulatory Visit (HOSPITAL_COMMUNITY)
Admission: RE | Admit: 2022-09-19 | Discharge: 2022-09-19 | Disposition: A | Discharge status: Home / Self Care | Payer: 59 | Source: Ambulatory Visit | Attending: Adult Health | Admitting: Adult Health

## 2022-09-19 DIAGNOSIS — I739 Peripheral vascular disease, unspecified: Secondary | ICD-10-CM

## 2022-09-19 DIAGNOSIS — I350 Nonrheumatic aortic (valve) stenosis: Secondary | ICD-10-CM

## 2022-09-19 DIAGNOSIS — R9431 Abnormal electrocardiogram [ECG] [EKG]: Secondary | ICD-10-CM

## 2022-09-19 MED ORDER — IOHEXOL 350 MG/ML SOLN
95.0000 mL | Freq: Once | INTRAVENOUS | Status: AC | PRN
  Administered 2022-09-19: 95 mL via INTRAVENOUS

## 2022-09-19 MED ORDER — NITROGLYCERIN 0.4 MG SL SUBL
0.8000 mg | SUBLINGUAL_TABLET | SUBLINGUAL | Status: DC | PRN
  Administered 2022-09-19: 0.8 mg via SUBLINGUAL

## 2022-09-19 MED ORDER — NITROGLYCERIN 0.4 MG SL SUBL
SUBLINGUAL_TABLET | SUBLINGUAL | Status: AC
  Filled 2022-09-19: qty 2

## 2022-09-19 NOTE — Progress Notes (Signed)
[facility] [place]  ASSESSMENT / PLAN: Kelly [name] is a 67 y.o. female with history of aortobifemoral bypass 06/20/13; now with ventral hernia.  Patient counseled {pad risk2:26283}  WIfI score calculated based on clinical exam and non-invasive measurements. {WIFIvascular:26096}  Recommend:  Abstinence from all tobacco products. Blood glucose control with goal A1c < 7%. Blood pressure control with goal blood pressure < 140/90 mmHg. Lipid reduction therapy with goal LDL-C <100 mg/dL (***<09 if symptomatic from PAD).  Aspirin 81mg  PO QD.  *** Clopidogrel 75mg  PO QD. *** Rivaroxaban 2.5mg  PO BID. *** Cilostozal 100mg  PO BID for intermittent claudication without evidence of heart failure. Atorvastatin 40-80mg  PO QD (or other "high intensity" statin therapy). *** Daily walking to and past the point of discomfort. Patient counseled to keep a log of exercise distance. *** Adequate hydration (at least 2 liters / day) if patient's heart and kidney function is adequate.  Plan *** lower extremity angiogram with possible intervention via *** approach in cath lab ***.    CHIEF COMPLAINT: ***  HISTORY OF PRESENT ILLNESS: Kelly [name] is a 67 y.o. female ***  VASCULAR SURGICAL HISTORY: ***  VASCULAR RISK FACTORS: {FINDINGS; POSITIVE NEGATIVE:[phone]} history of stroke / transient ischemic attack. {FINDINGS; POSITIVE NEGATIVE:[phone]} history of coronary artery disease. *** history of PCI. *** history of CABG.  {FINDINGS; POSITIVE NEGATIVE:[phone]} history of diabetes mellitus. Last A1c ***. {FINDINGS; POSITIVE NEGATIVE:[phone]} history of smoking. *** actively smoking. {FINDINGS; POSITIVE NEGATIVE:[phone]} history of hypertension. *** drug regimen with *** control. {FINDINGS; POSITIVE NEGATIVE:[phone]} history of chronic kidney disease.  Last GFR ***. CKD {stage:30421363}. {FINDINGS; POSITIVE NEGATIVE:[phone]} history of chronic obstructive  pulmonary disease, treated with ***.  FUNCTIONAL STATUS: ECOG performance status: {findings; ecog performance status:31780} Ambulatory status: {TNHAmbulation:25868}  CAREY 1 AND 3 YEAR INDEX Female (2pts) 75-79 or 80-84 (2pts) >84 (3pts) Dependence in toileting (1pt) Partial or full dependence in dressing (1pt) History of malignant neoplasm (2pts) CHF (3pts) COPD (1pts) CKD (3pts)  0-3 pts 6% 1 year mortality ; 21% 3 year mortality 4-5 pts 12% 1 year mortality ; 36% 3 year mortality >5 pts 21% 1 year mortality; 54% 3 year mortality   Past Medical History:  Diagnosis Date   Blood in stool    SEES OCCASIONAL BLOOD IN STOOL....   Claudication (HCC)    Headache(784.0)    Hyperlipidemia    Hypertension    Tobacco abuse     Past Surgical History:  Procedure Laterality Date   ABDOMINAL AORTAGRAM N/A 05/30/2013   Procedure: ABDOMINAL Ronny Flurry;  Surgeon: Runell Gess, MD;  Location: [facility] CATH LAB;  Service: Cardiovascular;  Laterality: N/A;   ABDOMINAL HYSTERECTOMY     AORTA - BILATERAL FEMORAL ARTERY BYPASS GRAFT N/A 06/20/2013   Procedure: AORTA BIFEMORAL BYPASS GRAFT WITH REIMPLANTATION OF THE INFERIOR MESENTERIC ARTERY;  Surgeon: Nada Libman, MD;  Location: [facility] OR;  Service: Vascular;  Laterality: N/A;   BLADDER REPAIR W/ CESAREAN SECTION      Family History  Problem Relation Age of Onset   Hypertension Mother    Diabetes Mother    Hyperlipidemia Mother    Varicose Veins Mother    Heart disease Mother    Heart disease Father    Hypertension Father    Heart attack Father    Hyperlipidemia Sister    Hypertension Sister    Diabetes Brother    Hypertension Brother    Heart attack Brother    Heart disease Brother     Social History  Socioeconomic History   Marital status: Widowed    Spouse name: Not on file   Number of children: Not on file   Years of education: Not on file   Highest education level: Not on file  Occupational History   Not on file   Tobacco Use   Smoking status: Former    Types: Cigarettes    Quit date: 06/12/2013    Years since quitting: 9.2   Smokeless tobacco: Never   Tobacco comments:    states she smokes 0-1 cig per day  Vaping Use   Vaping Use: Never used  Substance and Sexual Activity   Alcohol use: Yes    Alcohol/week: 1.0 standard drink of alcohol    Types: 1 Cans of beer per week   Drug use: No   Sexual activity: Not on file  Other Topics Concern   Not on file  Social History Narrative   Not on file   Social Determinants of Health   Financial Resource Strain: Not on file  Food Insecurity: Not on file  Transportation Needs: Not on file  Physical Activity: Not on file  Stress: Not on file  Social Connections: Not on file  Intimate Partner Violence: Not on file    No Known Allergies  Current Outpatient Medications  Medication Sig Dispense Refill   aspirin EC 81 MG tablet Take 81 mg by mouth daily as needed (for blood thinner). (Patient not taking: Reported on 09/09/2022)     atorvastatin (LIPITOR) 40 MG tablet TAKE 1 Tablet BY MOUTH ONCE DAILY 90 tablet 0   metoprolol tartrate (LOPRESSOR) 25 MG tablet TAKE 1 Tablet BY MOUTH TWICE DAILY 180 tablet 0   nitroGLYCERIN (NITROSTAT) 0.4 MG SL tablet Place 1 tablet (0.4 mg total) under the tongue every 5 (five) minutes as needed for chest pain. (Patient not taking: Reported on 09/09/2022) 45 tablet 3   PRED FORTE 1 % ophthalmic suspension Place 1 drop into the left eye 4 (four) times daily. (Patient not taking: Reported on 09/09/2022)     predniSONE (STERAPRED UNI-PAK 21 TAB) 10 MG (21) TBPK tablet Take as directed (Patient not taking: Reported on 09/09/2022) 21 tablet 0   valsartan (DIOVAN) 40 MG tablet Take 40 mg by mouth daily.     No current facility-administered medications for this visit.    PHYSICAL EXAM There were no vitals filed for this visit.  Constitutional: *** appearing. *** distress. Appears *** nourished.  Neurologic: CN ***. ***  focal findings. *** sensory loss. Psychiatric: *** Mood and affect symmetric and appropriate. Eyes: *** No icterus. No conjunctival pallor. Ears, nose, throat: *** mucous membranes moist. Midline trachea.  Cardiac: *** rate and rhythm.  Respiratory: *** unlabored. Abdominal: *** soft, non-tender, non-distended.  Peripheral vascular: *** Extremity: *** edema. *** cyanosis. *** pallor.  Skin: *** gangrene. *** ulceration.  Lymphatic: *** Stemmer's sign. *** palpable lymphadenopathy.    PERTINENT LABORATORY AND RADIOLOGIC DATA  Most recent CBC    Latest Ref Rng & Units 01/13/2021   10:49 AM 10/21/2015    1:15 PM 06/25/2013    5:45 AM  CBC  WBC 4.0 - 10.5 K/uL 5.9  4.8  6.6   Hemoglobin 12.0 - 15.0 g/dL 16.1  09.6  04.5   Hematocrit 36.0 - 46.0 % 39.6  40.4  28.8   Platelets 150.0 - 400.0 K/uL 274.0  289  218      Most recent CMP    Latest Ref Rng & Units 09/09/2022  2:52 PM 01/13/2021   10:49 AM 10/21/2015    1:15 PM  CMP  Glucose 70 - 99 mg/dL 99  94  97   BUN 8 - 27 mg/dL 6  10  7    Creatinine 0.57 - 1.00 mg/dL 1.61  0.96  0.45   Sodium 134 - 144 mmol/L 141  140  138   Potassium 3.5 - 5.2 mmol/L 4.7  3.8  3.7   Chloride 96 - 106 mmol/L 105  105  108   CO2 20 - 29 mmol/L 20  28  22    Calcium 8.7 - 10.3 mg/dL 40.9  81.1  9.9   Total Protein 6.0 - 8.3 g/dL  7.8  8.1   Total Bilirubin 0.2 - 1.2 mg/dL  0.6  0.5   Alkaline Phos 39 - 117 U/L  78  65   AST 0 - 37 U/L  17  18   ALT 0 - 35 U/L  14  15     Renal function Estimated Creatinine Clearance: 73.4 mL/min (by C-G formula based on SCr of 0.83 mg/dL).  No results found for: "HGBA1C"  LDL Cholesterol  Date Value Ref Range Status  01/13/2021 92 0 - 99 mg/dL Final    +-------+-----------+-----------+------------+------------+  ABI/TBIToday's ABIToday's TBIPrevious ABIPrevious TBI  +-------+-----------+-----------+------------+------------+  Right 1.01       0.59       0.90        0.63           +-------+-----------+-----------+------------+------------+  Left  0.96       0.74       0.99        0.46          +-------+-----------+-----------+------------+------------+    Rande Brunt. Lenell Antu, MD FACS [facility] Phone Number: [phone] 09/19/2022 9:31 AM   Total time spent on preparing this encounter including chart review, data review, collecting history, examining the patient, coordinating care for this established patient, 30 minutes.  Portions of this report may have been transcribed using voice recognition software.  Every effort has been made to ensure accuracy; however, inadvertent computerized transcription errors may still be present.

## 2022-09-20 ENCOUNTER — Ambulatory Visit (INDEPENDENT_AMBULATORY_CARE_PROVIDER_SITE_OTHER): Payer: 59 | Admitting: Vascular Surgery

## 2022-09-20 ENCOUNTER — Encounter: Payer: Self-pay | Admitting: Vascular Surgery

## 2022-09-20 ENCOUNTER — Ambulatory Visit (HOSPITAL_COMMUNITY)
Admission: RE | Admit: 2022-09-20 | Discharge: 2022-09-20 | Disposition: A | Discharge status: Home / Self Care | Payer: 59 | Source: Ambulatory Visit | Attending: Vascular Surgery | Admitting: Vascular Surgery

## 2022-09-20 ENCOUNTER — Telehealth: Payer: Self-pay

## 2022-09-20 ENCOUNTER — Other Ambulatory Visit: Payer: Self-pay

## 2022-09-20 VITALS — BP 150/91 | HR 58 | Temp 97.9°F | Resp 20 | Ht 66.0 in | Wt 193.0 lb

## 2022-09-20 DIAGNOSIS — K439 Ventral hernia without obstruction or gangrene: Secondary | ICD-10-CM | POA: Diagnosis not present

## 2022-09-20 DIAGNOSIS — I739 Peripheral vascular disease, unspecified: Secondary | ICD-10-CM | POA: Insufficient documentation

## 2022-09-20 LAB — VAS US ABI WITH/WO TBI
Left ABI: 0.96
Right ABI: 1.01

## 2022-09-20 MED ORDER — ATORVASTATIN CALCIUM 80 MG PO TABS: 80.0000 mg | ORAL_TABLET | Freq: Every day | ORAL | 3 refills | Status: AC

## 2022-09-20 NOTE — Telephone Encounter (Addendum)
Called patient regarding results. Patient had understanding of results. Patient advised increasing Atorvastatin from 40 mg Daily to 80 mg Daily. Advised patient if has any chest pain go to ED,----- Message from Jodelle Gross, NP sent at 09/20/2022  7:38 AM EDT ----- I have reviewed her cardiac CTA. She has blockages in 2 of her coronary arteries, but not significant enough to have any further testing or intervention during a cardiac cath. The right coronary artery has 30-49% blockage, the obtuse marginal, a small artery in the back of her heart, has 50-69% blockage We will watch this and can do a stress test to make sure she is not having blood flow issues from this. She should increase her atorvastatin dose to 80 mg daily.  We will see her on follow up in August.

## 2022-09-28 ENCOUNTER — Encounter (HOSPITAL_COMMUNITY): Payer: Self-pay

## 2022-09-29 ENCOUNTER — Telehealth: Payer: Self-pay | Admitting: Internal Medicine

## 2022-09-29 MED ORDER — VALSARTAN 40 MG PO TABS: 40.0000 mg | ORAL_TABLET | Freq: Every day | ORAL | 3 refills | Status: AC

## 2022-09-29 NOTE — Telephone Encounter (Signed)
Pt c/o medication issue:  1. Name of Medication:   atorvastatin (LIPITOR) 80 MG tablet   2. How are you currently taking this medication (dosage and times per day)?  As prescribed  3. Are you having a reaction (difficulty breathing--STAT)?   Diarrhea, weakness and headache  4. What is your medication issue?   Daughter state patient says this medication is making her sick and she has not taken any medication today.   Daughter wants patient called directly.

## 2022-09-29 NOTE — Telephone Encounter (Signed)
Patient states the atorvastatin gives her a headache.  Stools are increased to where feels like urination.  She states this has been since the increase to 80 mg from 40 mg.  She ask if she can return back to the 40 mg dose.  She started the increase last week and issues since starting. Advised hold today and tomorrow to get response from provider if she should then resume to 40 mg.   She has gotten confused regarding medications.  Her daughter is with her and they are trying to figure her meds.  Patient states she has stopped Valsartan.  She is adamant that the doctor told her to stop these.  Do not see this reflected on med list or notes.  She continues to bring out "other meds" from somewhere in her house.   The daughter thinks patient is taking her Atorvastatin 40 mg and Atorvastatin 80 mg.   Review of all meds with daughter and patient.  Daughter is redoing her med box now.  It again looks that she increased her Atorvastatin to total 120 mg daily.  Advised to HOLD atorvastatin 2 days and then resume at 80 mg dose.  If symptoms resume she is to call to let us know.   If Valsartan was Dc'd please advise as not seen and patient is told to continue.  Refill called in.

## 2022-10-03 ENCOUNTER — Other Ambulatory Visit: Payer: Self-pay

## 2022-10-03 DIAGNOSIS — I739 Peripheral vascular disease, unspecified: Secondary | ICD-10-CM

## 2022-10-11 ENCOUNTER — Ambulatory Visit (HOSPITAL_COMMUNITY): Payer: 59 | Attending: Adult Health

## 2022-10-11 DIAGNOSIS — I739 Peripheral vascular disease, unspecified: Secondary | ICD-10-CM | POA: Insufficient documentation

## 2022-10-11 DIAGNOSIS — I350 Nonrheumatic aortic (valve) stenosis: Secondary | ICD-10-CM | POA: Diagnosis not present

## 2022-10-11 LAB — ECHOCARDIOGRAM COMPLETE
Area-P 1/2: 3.53 cm2
S' Lateral: 2.7 cm

## 2022-10-11 NOTE — Progress Notes (Signed)
Kelly [name] had normal heart pumping function. She has mild mitral valve backflow but not concerning. There is a little bit of calcium on her aortic valve which is also no concerning   Overall essentially normal echo.  Awaiting cardiac CTA before we clear her for surgery from cardiac standpoint.   KL

## 2022-10-17 ENCOUNTER — Telehealth: Payer: Self-pay

## 2022-10-17 NOTE — Telephone Encounter (Addendum)
Called patient regarding results. Patient had understanding of results.----- Message from Joni Reining sent at 10/11/2022  1:40 PM EDT ----- Kelly [name] had normal heart pumping function. She has mild mitral valve backflow but not concerning. There is a little bit of calcium on her aortic valve which is also no concerning   Overall essentially normal echo.  Awaiting cardiac CTA before we clear her for surgery from cardiac standpoint.   KL

## 2022-10-20 NOTE — Progress Notes (Signed)
Cardiology Clinic Note   Patient Name: Kelly [name] Date of Encounter: 10/21/2022  Primary Care Provider:  Ellyn Hack, MD Primary Cardiologist:  Parke Poisson, MD  Patient Profile    67 year old female patient with history of hypertension, PAD with history of aortic bifemoral bypass due to bilateral leg claudication in 2015, hyperlipidemia, and chest discomfort with associated dyspnea on exertion.  Has not been seen in the office since 2020.  She was ordered a Cardia CT  and echocardiogram  Of note most recent nuclear medicine stress test was in 2015 and was negative for ischemia.   Past Medical History    Past Medical History:  Diagnosis Date   Blood in stool    SEES OCCASIONAL BLOOD IN STOOL....   Claudication (HCC)    Headache(784.0)    Hyperlipidemia    Hypertension    Tobacco abuse    Past Surgical History:  Procedure Laterality Date   ABDOMINAL AORTAGRAM N/A 05/30/2013   Procedure: ABDOMINAL Ronny Flurry;  Surgeon: Runell Gess, MD;  Location: [facility] CATH LAB;  Service: Cardiovascular;  Laterality: N/A;   ABDOMINAL HYSTERECTOMY     AORTA - BILATERAL FEMORAL ARTERY BYPASS GRAFT N/A 06/20/2013   Procedure: AORTA BIFEMORAL BYPASS GRAFT WITH REIMPLANTATION OF THE INFERIOR MESENTERIC ARTERY;  Surgeon: Nada Libman, MD;  Location: [facility] OR;  Service: Vascular;  Laterality: N/A;   BLADDER REPAIR W/ CESAREAN SECTION      Allergies  No Known Allergies  History of Present Illness    Mrs. Sirmons comes today for ongoing assessment and management of hypertension, hyperlipidemia, history of PAD status post post aortic fem bypass in 2015. She is to have hernia repair by Dr.Toth. She complains of chronic abdominal pain, and also periods of depression. She has not had any labs for follow up on statin therapy for lipid levels.   She comes today with continued complaints of abdominal pain, swelling, and cramping.  She states she does feel some fullness in her chest sometimes  when she is having the abdominal pain.  She drinks water and that does help to relieve it.  She denies any dyspnea on exertion, dizziness, palpitations, she is complaining of generalized fatigue.  Home Medications    Current Outpatient Medications  Medication Sig Dispense Refill   aspirin EC 81 MG tablet Take 81 mg by mouth daily as needed (for blood thinner).     atorvastatin (LIPITOR) 80 MG tablet Take 1 tablet (80 mg total) by mouth daily. 90 tablet 3   metoprolol tartrate (LOPRESSOR) 25 MG tablet TAKE 1 Tablet BY MOUTH TWICE DAILY 180 tablet 0   valsartan (DIOVAN) 40 MG tablet Take 1 tablet (40 mg total) by mouth daily. 90 tablet 3   nitroGLYCERIN (NITROSTAT) 0.4 MG SL tablet Place 1 tablet (0.4 mg total) under the tongue every 5 (five) minutes as needed for chest pain. (Patient not taking: Reported on 09/09/2022) 45 tablet 3   No current facility-administered medications for this visit.     Family History    Family History  Problem Relation Age of Onset   Hypertension Mother    Diabetes Mother    Hyperlipidemia Mother    Varicose Veins Mother    Heart disease Mother    Heart disease Father    Hypertension Father    Heart attack Father    Hyperlipidemia Sister    Hypertension Sister    Diabetes Brother    Hypertension Brother    Heart attack Brother  Heart disease Brother    She indicated that her mother is deceased. She indicated that her father is deceased. She indicated that the status of her sister is unknown. She indicated that the status of her brother is unknown.  Social History    Social History   Socioeconomic History   Marital status: Widowed    Spouse name: Not on file   Number of children: Not on file   Years of education: Not on file   Highest education level: Not on file  Occupational History   Not on file  Tobacco Use   Smoking status: Former    Current packs/day: 0.00    Types: Cigarettes    Quit date: 06/12/2013    Years since quitting: 9.3    Smokeless tobacco: Never   Tobacco comments:    states she smokes 0-1 cig per day  Vaping Use   Vaping status: Never Used  Substance and Sexual Activity   Alcohol use: Yes    Alcohol/week: 1.0 standard drink of alcohol    Types: 1 Cans of beer per week   Drug use: No   Sexual activity: Not on file  Other Topics Concern   Not on file  Social History Narrative   Not on file   Social Determinants of Health   Financial Resource Strain: Not on file  Food Insecurity: Not on file  Transportation Needs: Not on file  Physical Activity: Not on file  Stress: Not on file  Social Connections: Not on file  Intimate Partner Violence: Not on file     Review of Systems    General:  No chills, fever, night sweats or weight changes.  Cardiovascular:  No chest pain, dyspnea on exertion, edema, orthopnea, palpitations, paroxysmal nocturnal dyspnea. Dermatological: No rash, lesions/masses Respiratory: No cough, dyspnea Urologic: No hematuria, dysuria Abdominal:   No nausea, vomiting, diarrhea, bright red blood per rectum, melena, or hematemesis Neurologic:  No visual changes, wkns, changes in mental status. All other systems reviewed and are otherwise negative except as noted above.       Physical Exam    VS:  BP 130/82 (BP Location: Left Arm, Patient Position: Sitting, Cuff Size: Normal)   Pulse 67   Ht 5\' 6"  (1.676 m)   Wt 191 lb 3.2 oz (86.7 kg)   SpO2 97%   BMI 30.86 kg/m  , BMI Body mass index is 30.86 kg/m.     GEN: Well nourished, well developed, in no acute distress. HEENT: normal. Neck: Supple, no JVD, carotid bruits, or masses. Cardiac: RRR, no murmurs, rubs, or gallops. No clubbing, cyanosis, edema.  Radials/DP/PT 2+ and equal bilaterally.  Respiratory:  Respirations regular and unlabored, clear to auscultation bilaterally. GI: Soft, tender to palpation,, nondistended, BS + x 4. MS: no deformity or atrophy. Skin: warm and dry, no rash. Neuro:  Strength and sensation  are intact. Psych: Normal affect.      Lab Results  Component Value Date   WBC 5.9 01/13/2021   HGB 12.6 01/13/2021   HCT 39.6 01/13/2021   MCV 80.1 01/13/2021   PLT 274.0 01/13/2021   Lab Results  Component Value Date   CREATININE 0.83 09/09/2022   BUN 6 (L) 09/09/2022   NA 141 09/09/2022   K 4.7 09/09/2022   CL 105 09/09/2022   CO2 20 09/09/2022   Lab Results  Component Value Date   ALT 14 01/13/2021   AST 17 01/13/2021   ALKPHOS 78 01/13/2021   BILITOT 0.6 01/13/2021  Lab Results  Component Value Date   CHOL 161 01/13/2021   HDL 43.60 01/13/2021   LDLCALC 92 01/13/2021   TRIG 128.0 01/13/2021   CHOLHDL 4 01/13/2021    No results found for: "HGBA1C"   Review of Prior Studies    Echocardiogram 10/11/2022 1. Left ventricular ejection fraction, by estimation, is 60 to 65%. The  left ventricle has normal function. The left ventricle has no regional  wall motion abnormalities. Left ventricular diastolic parameters are  consistent with Grade I diastolic  dysfunction (impaired relaxation).   2. Right ventricular systolic function is normal. The right ventricular  size is normal. There is normal pulmonary artery systolic pressure.   3. Left atrial size was mildly dilated.   4. The mitral valve is normal in structure. Mild mitral valve  regurgitation. No evidence of mitral stenosis.   5. The aortic valve is tricuspid. There is mild calcification of the  aortic valve. Aortic valve regurgitation is not visualized. No aortic  stenosis is present.   6. The inferior vena cava is normal in size with greater than 50%  respiratory variability, suggesting right atrial pressure of 3 mmHg.   Coronary CTA 09/19/2022:IMPRESSION: 1. Coronary calcium score of 102. This was 47 percentile for age and sex matched control.   2. Total plaque volume (TPV) 243 mm3 which is 57th percentile for age-and sex matched controls (calcified plaque 10 mm3; non-calcified plaque 233 mm3). TPV is  moderate.   3. Normal coronary origin with right dominance.   4. OM1-mid 50-69% moderate stenosis. RCA - mild mid stenosis 30-49%. Non obstructive disease.   5. Aortic atherosclerosis.      Assessment and Plan:  Hypertension: Blood pressure is very well-controlled today improved from last office visit.  Continue current medication regimen with valsartan 40 mg daily and metoprolol tartrate 25 mg twice daily.  2.  Coronary artery disease: Status post coronary CTA.  Calcium score of 102, OM 2 mid 50 to 69% moderate stenosis, RCA mid to mid stenosis of 30 to 49% found to be nonobstructive disease.  She remains on statin therapy per guidelines.  She is essentially asymptomatic.  Pressure in her chest she feels is coming from her abdomen.  She denies associated shortness of breath dizziness or significant chest pain.  I think she would be safe to proceed with hernia repair with Dr. Carolynne Edouard from a cardiac standpoint.  No further cardiac testing is recommended.  3.  Hypercholesterolemia: Patient remains on atorvastatin 80 mg daily.  She has not had fasting lipids and LFTs drawn this year.  Will have these completed next week as she has not been fasting at the time of this office appointment.  4.  PAD: Followed by Dr. Lenell Antu, she is status post aortobifemoral bypass on 06/20/2013.  Signed, Bettey Mare. Liborio Nixon, ANP, AACC   10/21/2022 12:06 PM      Office [phone] Fax [phone]  Notice: This dictation was prepared with Dragon dictation along with smaller phrase technology. Any transcriptional errors that result from this process are unintentional and may not be corrected upon review.

## 2022-10-21 ENCOUNTER — Encounter: Payer: Self-pay | Admitting: Adult Health

## 2022-10-21 ENCOUNTER — Ambulatory Visit: Payer: Self-pay | Admitting: General Surgery

## 2022-10-21 ENCOUNTER — Ambulatory Visit: Payer: 59 | Attending: Adult Health | Admitting: Adult Health

## 2022-10-21 VITALS — BP 130/82 | HR 67 | Ht 66.0 in | Wt 191.2 lb

## 2022-10-21 DIAGNOSIS — E7800 Pure hypercholesterolemia, unspecified: Secondary | ICD-10-CM | POA: Diagnosis not present

## 2022-10-21 DIAGNOSIS — R072 Precordial pain: Secondary | ICD-10-CM

## 2022-10-21 DIAGNOSIS — I10 Essential (primary) hypertension: Secondary | ICD-10-CM

## 2022-10-21 DIAGNOSIS — I739 Peripheral vascular disease, unspecified: Secondary | ICD-10-CM

## 2022-10-21 DIAGNOSIS — I2510 Atherosclerotic heart disease of native coronary artery without angina pectoris: Secondary | ICD-10-CM | POA: Diagnosis not present

## 2022-10-21 NOTE — Patient Instructions (Signed)
Medication Instructions:  No Changes *If you need a refill on your cardiac medications before your next appointment, please call your pharmacy*   Lab Work: CMET, Lipid Panel If you have labs (blood work) drawn today and your tests are completely normal, you will receive your results only by: MyChart Message (if you have MyChart) OR A paper copy in the mail If you have any lab test that is abnormal or we need to change your treatment, we will call you to review the results.   Testing/Procedures: No Testing   Follow-Up: At [facility], you and your health needs are our priority.  As part of our continuing mission to provide you with exceptional heart care, we have created designated Provider Care Teams.  These Care Teams include your primary Cardiologist (physician) and Advanced Practice Providers (APPs -  Physician Assistants and Nurse Practitioners) who all work together to provide you with the care you need, when you need it.  We recommend signing up for the patient portal called "MyChart".  Sign up information is provided on this After Visit Summary.  MyChart is used to connect with patients for Virtual Visits (Telemedicine).  Patients are able to view lab/test results, encounter notes, upcoming appointments, etc.  Non-urgent messages can be sent to your provider as well.   To learn more about what you can do with MyChart, go to [email or web address].    Your next appointment:   Keep Scheduled Appointment  Provider:   Parke Poisson, MD

## 2022-10-27 ENCOUNTER — Telehealth: Payer: Self-pay

## 2022-10-27 NOTE — Telephone Encounter (Addendum)
Called patient regarding results. Unable to leave message.----- Message from Joni Reining sent at 10/25/2022  3:50 PM EDT ----- I have reviewed her labs.  Essentially normal labs with the exception of alkaline phosphatase (this is related to the liver and bone). Should follow up with PCP for more recommendations.   KL

## 2022-10-28 NOTE — Progress Notes (Signed)
Surgical Instructions   Your procedure is scheduled on Wednesday, August 21st. Report to [facility] Main Entrance "A" at 06:30 A.M., then check in with the Admitting office. Any questions or running late day of surgery: call [phone]  Questions prior to your surgery date: call [phone], Monday-Friday, 8am-4pm. If you experience any cold or flu symptoms such as cough, fever, chills, shortness of breath, etc. between now and your scheduled surgery, please notify us at the above number.     Remember:  Do not eat after midnight the night before your surgery   You may drink clear liquids until 05:30 AM the morning of your surgery.   Clear liquids allowed are: Water, Non-Citrus Juices (without pulp), Carbonated Beverages, Clear Tea, Black Coffee Only (NO MILK, CREAM OR POWDERED CREAMER of any kind), and Gatorade.    Take these medicines the morning of surgery with A SIP OF WATER  atorvastatin (LIPITOR)  metoprolol tartrate (LOPRESSOR)   May take these medicines IF NEEDED: nitroGLYCERIN (NITROSTAT)- if you have to take this medication prior to surgery, please call one of the above listed phone numbers and report this to a nurse   Follow your surgeon's instructions on when to stop Aspirin.  If no instructions were given by your surgeon then you will need to call the office to get those instructions.     One week prior to surgery, STOP taking any Aleve, Naproxen, Ibuprofen, Motrin, Advil, Goody's, BC's, all herbal medications, fish oil, and non-prescription vitamins.                     Do NOT Smoke (Tobacco/Vaping) for 24 hours prior to your procedure.  If you use a CPAP at night, you may bring your mask/headgear for your overnight stay.   You will be asked to remove any contacts, glasses, piercing's, hearing aid's, dentures/partials prior to surgery. Please bring cases for these items if needed.    Patients discharged the day of surgery will not be allowed to drive home, and  someone needs to stay with them for 24 hours.  SURGICAL WAITING ROOM VISITATION Patients may have no more than 2 support people in the waiting area - these visitors may rotate.   Pre-op nurse will coordinate an appropriate time for 1 ADULT support person, who may not rotate, to accompany patient in pre-op.  Children under the age of 63 must have an adult with them who is not the patient and must remain in the main waiting area with an adult.  If the patient needs to stay at the hospital during part of their recovery, the visitor guidelines for inpatient rooms apply.  Please refer to the [facility] website for the visitor guidelines for any additional information.   If you received a COVID test during your pre-op visit  it is requested that you wear a mask when out in public, stay away from anyone that may not be feeling well and notify your surgeon if you develop symptoms. If you have been in contact with anyone that has tested positive in the last 10 days please notify you surgeon.      Pre-operative CHG Bathing Instructions   You can play a key role in reducing the risk of infection after surgery. Your skin needs to be as free of germs as possible. You can reduce the number of germs on your skin by washing with CHG (chlorhexidine gluconate) soap before surgery. CHG is an antiseptic soap that kills germs and continues to  kill germs even after washing.   DO NOT use if you have an allergy to chlorhexidine/CHG or antibacterial soaps. If your skin becomes reddened or irritated, stop using the CHG and notify one of our RNs at [phone].              TAKE A SHOWER THE NIGHT BEFORE SURGERY AND THE DAY OF SURGERY    Please keep in mind the following:  DO NOT shave, including legs and underarms, 48 hours prior to surgery.   You may shave your face before/day of surgery.  Place clean sheets on your bed the night before surgery Use a clean washcloth (not used since being washed) for each  shower. DO NOT sleep with pet's night before surgery.  CHG Shower Instructions:  If you choose to wash your hair and private area, wash first with your normal shampoo/soap.  After you use shampoo/soap, rinse your hair and body thoroughly to remove shampoo/soap residue.  Turn the water OFF and apply half the bottle of CHG soap to a CLEAN washcloth.  Apply CHG soap ONLY FROM YOUR NECK DOWN TO YOUR TOES (washing for 3-5 minutes)  DO NOT use CHG soap on face, private areas, open wounds, or sores.  Pay special attention to the area where your surgery is being performed.  If you are having back surgery, having someone wash your back for you may be helpful. Wait 2 minutes after CHG soap is applied, then you may rinse off the CHG soap.  Pat dry with a clean towel  Put on clean pajamas    Additional instructions for the day of surgery: DO NOT APPLY any lotions, deodorants, cologne, or perfumes.   Do not wear jewelry or makeup Do not wear nail polish, gel polish, artificial nails, or any other type of covering on natural nails (fingers and toes) Do not bring valuables to the hospital. [facility] is not responsible for valuables/personal belongings. Put on clean/comfortable clothes.  Please brush your teeth.  Ask your nurse before applying any prescription medications to the skin.

## 2022-10-31 ENCOUNTER — Other Ambulatory Visit: Payer: Self-pay

## 2022-10-31 ENCOUNTER — Telehealth: Payer: Self-pay

## 2022-10-31 ENCOUNTER — Encounter (HOSPITAL_COMMUNITY): Payer: Self-pay

## 2022-10-31 ENCOUNTER — Encounter (HOSPITAL_COMMUNITY)
Admission: RE | Admit: 2022-10-31 | Discharge: 2022-10-31 | Disposition: A | Discharge status: Home / Self Care | Payer: 59 | Source: Ambulatory Visit | Attending: General Surgery | Admitting: General Surgery

## 2022-10-31 VITALS — BP 128/94 | HR 55 | Temp 98.2°F | Resp 18 | Ht 66.0 in | Wt 192.1 lb

## 2022-10-31 DIAGNOSIS — Z01818 Encounter for other preprocedural examination: Secondary | ICD-10-CM | POA: Diagnosis present

## 2022-10-31 DIAGNOSIS — Z01812 Encounter for preprocedural laboratory examination: Secondary | ICD-10-CM | POA: Diagnosis not present

## 2022-10-31 DIAGNOSIS — I10 Essential (primary) hypertension: Secondary | ICD-10-CM | POA: Insufficient documentation

## 2022-10-31 DIAGNOSIS — E785 Hyperlipidemia, unspecified: Secondary | ICD-10-CM | POA: Diagnosis not present

## 2022-10-31 DIAGNOSIS — Z87891 Personal history of nicotine dependence: Secondary | ICD-10-CM | POA: Diagnosis not present

## 2022-10-31 DIAGNOSIS — K439 Ventral hernia without obstruction or gangrene: Secondary | ICD-10-CM | POA: Diagnosis not present

## 2022-10-31 HISTORY — DX: Peripheral vascular disease, unspecified: I73.9

## 2022-10-31 LAB — CBC
HCT: 40.8 % (ref 36.0–46.0)
Hemoglobin: 12.7 g/dL (ref 12.0–15.0)
MCH: 24.5 pg — ABNORMAL LOW (ref 26.0–34.0)
MCHC: 31.1 g/dL (ref 30.0–36.0)
MCV: 78.6 fL — ABNORMAL LOW (ref 80.0–100.0)
Platelets: 277 10*3/uL (ref 150–400)
RBC: 5.19 MIL/uL — ABNORMAL HIGH (ref 3.87–5.11)
RDW: 15.6 % — ABNORMAL HIGH (ref 11.5–15.5)
WBC: 6.2 10*3/uL (ref 4.0–10.5)
nRBC: 0 % (ref 0.0–0.2)

## 2022-10-31 NOTE — Telephone Encounter (Addendum)
Called patient regarding results. Unable to leave message. Letter mailed on 10/31/22.----- Message from Joni Reining sent at 10/25/2022  3:50 PM EDT ----- I have reviewed her labs.  Essentially normal labs with the exception of alkaline phosphatase (this is related to the liver and bone). Should follow up with PCP for more recommendations.   KL

## 2022-10-31 NOTE — Progress Notes (Addendum)
PCP - Hazel Sams Shah,MD Cardiologist - Lynda Rainwater Acharya,MD  PPM/ICD - denies Device Orders -  Rep Notified -   Chest x-ray - 05/24/13 EKG - 09/09/22 Stress Test - 05/20/13 ECHO - 10/11/22 Cardiac Cath - denies  Sleep Study - none CPAP - no  Fasting Blood Sugar - na Checks Blood Sugar _____ times a day  Last dose of GLP1 agonist-  na GLP1 instructions:   Blood Thinner Instructions:na Aspirin Instructions:pt instructed to call Dr. Billey Chang office to find out when to stop aspirin.   ERAS Protcol -clear liquids until 0530 PRE-SURGERY Ensure or G2- no  COVID TEST- na   Anesthesia review: yes- pt with history of HTN ,PAD,aortic bifemoral bypass. Seen in cardiologist's office 10/21/22 for clearance but had not been seen since 2020.  Patient denies shortness of breath, fever, cough and chest pain at PAT appointment   All instructions explained to the patient, with a verbal understanding of the material. Patient agrees to go over the instructions while at home for a better understanding. Patient also instructed to wear a mask when out in public prior to surgery. The opportunity to ask questions was provided.

## 2022-11-01 NOTE — Progress Notes (Signed)
Anesthesia Chart Review:  Case: [id number] Date/Time: 11/09/22 0815   Procedure: LAPAROSCOPIC ASSISTED  VENTRAL HERNIA REPAIR WITH MESH   Anesthesia type: General   Pre-op diagnosis: VENTRAL HERNIA   [facility]: [facility] ROOM 02 / [facility]   Surgeons: Griselda Miner, MD       DISCUSSION: Patient is a 67 year old female scheduled for the above procedure.  History includes former smoker (quit 06/12/13), HTN, PAD (AFBG with reimplantation of IMA 06/20/13 for infrarenal aortic occlusive disease with claudication), HLD, TAH/BSO (07/28/09). Non-obstructive CAD by 09/19/22 CCTA.  She had cardiology evaluation by Joni Reining, DNP on 10/21/22 for follow-up recent coronary CTA and echo which were done as part of her preoperative evaluation.  09/19/22 CCTA showed elevated coronary calcium score 102 (83rd percentile), 50-69% mid OM1, 30-49% mild RCA. 10/11/22 echo showed LVEF 60-65%, no regional wall motion abnormalities, grade 1 diastolic dysfunction, normal RV systolic function, normal PAP, mild MR. Her atorvastatin was increased to 80 mg daily. At 10/21/22 visit, patient was "essentially asymptomatic." Pressure in chest felt referred from abdomen and denied any associated dyspnea, dizziness, or chest pains. K. Lawrence, DNP wrote, "I think she would be safe to proceed with hernia repair with Dr. Carolynne Edouard from a cardiac standpoint.  No further cardiac testing is recommended."  Given prior AFBG, she was evaluated by vascular surgeon Dr. Heath Lark on 09/20/22. He reviewed her CT scan. He thought "it is safe for her to proceed with ventral hernia [re]pair with Dr. Carolynne Edouard." He recommended 1 year follow-up with Dr. Myra Gianotti.  Anesthesia team to evaluate on the day of surgery.   VS: BP (!) 128/94   Pulse (!) 55   Temp 36.8 C   Resp 18   Ht 5\' 6"  (1.676 m)   Wt 87.1 kg   SpO2 97%   BMI 31.01 kg/m    PROVIDERS: Ellyn Hack, MD is PCP [facility]) Weston Brass, MD is cardiologist  Venida Jarvis,  MD is vascular surgeon   LABS: Most recent lab results in [facility] include: Lab Results  Component Value Date   WBC 6.2 10/31/2022   HGB 12.7 10/31/2022   HCT 40.8 10/31/2022   PLT 277 10/31/2022   GLUCOSE 99 10/24/2022   CHOL 132 10/24/2022   TRIG 112 10/24/2022   HDL 36 (L) 10/24/2022   LDLCALC 75 10/24/2022   ALT 31 10/24/2022   AST 35 10/24/2022   NA 143 10/24/2022   K 4.0 10/24/2022   CL 106 10/24/2022   CREATININE 0.83 10/24/2022   BUN 8 10/24/2022   CO2 23 10/24/2022     IMAGES: CT Chest (over read CCTA) 09/19/22: IMPRESSION: No significant extracardiac incidental findings identified.   EKG: 09/09/22: Sinus bradycardia at 56 bpm ST & T wave abnormality, consider inferior ischemia ST & T wave abnormality, consider anterolateral ischemia When compared with ECG of 20-Jun-2013 16:21, T wave inversion now evident in Inferior leads T wave inversion now evident in Anterior leads - T wave inversion more prominent and new T wave inversion in I, aVL and inferior leads when compared to 08/16/18 tracing.   CV: Echo 10/11/22: IMPRESSIONS   1. Left ventricular ejection fraction, by estimation, is 60 to 65%. The  left ventricle has normal function. The left ventricle has no regional  wall motion abnormalities. Left ventricular diastolic parameters are  consistent with Grade I diastolic  dysfunction (impaired relaxation).   2. Right ventricular systolic function is normal. The right ventricular  size is normal. There is normal pulmonary artery systolic pressure.   3. Left atrial size was mildly dilated.   4. The mitral valve is normal in structure. Mild mitral valve  regurgitation. No evidence of mitral stenosis.   5. The aortic valve is tricuspid. There is mild calcification of the  aortic valve. Aortic valve regurgitation is not visualized. No aortic  stenosis is present.   6. The inferior vena cava is normal in size with greater than 50%  respiratory variability, suggesting  right atrial pressure of 3 mmHg.    CT Coronary 09/19/22: IMPRESSION: 1. Coronary calcium score of 102. This was 25 percentile for age and sex matched control. 2. Total plaque volume (TPV) 243 mm3 which is 57th percentile for age-and sex matched controls (calcified plaque 10 mm3; non-calcified plaque 233 mm3). TPV is moderate. 3. Normal coronary origin with right dominance. 4. OM1-mid 50-69% moderate stenosis. RCA - mild mid stenosis 30-49%. Non obstructive disease.  5. Aortic atherosclerosis.   LE Arterial Duplex/ABI's 09/20/22: Summary:  - Right: Resting right ankle-brachial index is within normal range. The  right toe-brachial index is abnormal.  - Left: Resting left ankle-brachial index is within normal range. The left  toe-brachial index is normal.    Nuclear stress test 04/30/13: Overall Impression:  Normal stress nuclear study. LV Wall Motion:  NL LV Function; NL Wall Motion   Past Medical History:  Diagnosis Date   Blood in stool    SEES OCCASIONAL BLOOD IN STOOL....   Claudication (HCC)    Headache(784.0)    Hyperlipidemia    Hypertension    Peripheral artery disease (HCC)    Tobacco abuse     Past Surgical History:  Procedure Laterality Date   ABDOMINAL AORTAGRAM N/A 05/30/2013   Procedure: ABDOMINAL Ronny Flurry;  Surgeon: Runell Gess, MD;  Location: [facility] CATH LAB;  Service: Cardiovascular;  Laterality: N/A;   ABDOMINAL HYSTERECTOMY     AORTA - BILATERAL FEMORAL ARTERY BYPASS GRAFT N/A 06/20/2013   Procedure: AORTA BIFEMORAL BYPASS GRAFT WITH REIMPLANTATION OF THE INFERIOR MESENTERIC ARTERY;  Surgeon: Nada Libman, MD;  Location: [facility] OR;  Service: Vascular;  Laterality: N/A;   BLADDER REPAIR W/ CESAREAN SECTION     EYE SURGERY Bilateral 2023   cataract    MEDICATIONS:  aspirin EC 81 MG tablet   atorvastatin (LIPITOR) 80 MG tablet   ibuprofen (ADVIL) 200 MG tablet   metoprolol tartrate (LOPRESSOR) 25 MG tablet   nitroGLYCERIN (NITROSTAT) 0.4 MG SL  tablet   valsartan (DIOVAN) 40 MG tablet   No current facility-administered medications for this encounter.  She was advised to follow-up with Dr. Billey Chang office regarding perioperative ASA instructions.   Shonna Chock, PA-C Surgical Short Stay/Anesthesiology [facility] Phone [phone] [facility] Phone [phone] 11/01/2022 2:34 PM

## 2022-11-01 NOTE — Anesthesia Preprocedure Evaluation (Addendum)
Anesthesia Evaluation  Patient identified by MRN, date of birth, ID band Patient awake    Reviewed: Allergy & Precautions, H&P , NPO status , Patient's Chart, lab work & pertinent test results  Airway Mallampati: II  TM Distance: >3 FB Neck ROM: Full    Dental no notable dental hx.    Pulmonary neg pulmonary ROS, former smoker   Pulmonary exam normal breath sounds clear to auscultation       Cardiovascular hypertension, + Peripheral Vascular Disease  Normal cardiovascular exam Rhythm:Regular Rate:Normal   Left Ventricle: Left ventricular ejection fraction, by estimation, is 60  to 65%. The left ventricle has normal function. The left ventricle has no  regional wall motion abnormalities. The left ventricular internal cavity  size was normal in size. There is   no left ventricular hypertrophy. Left ventricular diastolic parameters  are consistent with Grade I diastolic dysfunction (impaired relaxation).   Right Ventricle: The right ventricular size is normal. No increase in  right ventricular wall thickness. Right ventricular systolic function is  normal. There is normal pulmonary artery systolic pressure. The tricuspid  regurgitant velocity is 2.34 m/s, and   with an assumed right atrial pressure of 3 mmHg, the estimated right  ventricular systolic pressure is 24.9 mmHg.   Left Atrium: Left atrial size was mildly dilated.   Right Atrium: Right atrial size was normal in size.   Pericardium: There is no evidence of pericardial effusion.   Mitral Valve: The mitral valve is normal in structure. Mild mitral valve  regurgitation. No evidence of mitral valve stenosis.   Tricuspid Valve: The tricuspid valve is normal in structure. Tricuspid  valve regurgitation is mild . No evidence of tricuspid stenosis.   Aortic Valve: The aortic valve is tricuspid. There is mild calcification  of the aortic valve. Aortic valve regurgitation is  not visualized. No  aortic stenosis is present.   Pulmonic Valve: The pulmonic valve was normal in structure. Pulmonic valve  regurgitation is not visualized. No evidence of pulmonic stenosis.     Neuro/Psych negative neurological ROS  negative psych ROS   GI/Hepatic negative GI ROS, Neg liver ROS,,,  Endo/Other  negative endocrine ROS    Renal/GU negative Renal ROS  negative genitourinary   Musculoskeletal negative musculoskeletal ROS (+)    Abdominal   Peds negative pediatric ROS (+)  Hematology negative hematology ROS (+)   Anesthesia Other Findings   Reproductive/Obstetrics negative OB ROS                             Anesthesia Physical Anesthesia Plan  ASA: 3  Anesthesia Plan: General   Post-op Pain Management: Gabapentin PO (pre-op)* and Tylenol PO (pre-op)*   Induction: Intravenous  PONV Risk Score and Plan: 3 and Ondansetron, Dexamethasone and Treatment may vary due to age or medical condition  Airway Management Planned: Oral ETT  Additional Equipment:   Intra-op Plan:   Post-operative Plan: Extubation in OR  Informed Consent: I have reviewed the patients History and Physical, chart, labs and discussed the procedure including the risks, benefits and alternatives for the proposed anesthesia with the patient or authorized representative who has indicated his/her understanding and acceptance.     Dental advisory given  Plan Discussed with: CRNA and Surgeon  Anesthesia Plan Comments: (PAT note written 11/01/2022 by Shonna Chock, PA-C.  )       Anesthesia Quick Evaluation

## 2022-11-09 ENCOUNTER — Ambulatory Visit (HOSPITAL_COMMUNITY): Payer: 59 | Admitting: Vascular Surgery

## 2022-11-09 ENCOUNTER — Encounter (HOSPITAL_COMMUNITY): Payer: Self-pay | Admitting: General Surgery

## 2022-11-09 ENCOUNTER — Ambulatory Visit (HOSPITAL_COMMUNITY)
Admission: RE | Admit: 2022-11-09 | Discharge: 2022-11-10 | Disposition: A | Discharge status: Home / Self Care | Payer: 59 | Attending: General Surgery | Admitting: General Surgery

## 2022-11-09 ENCOUNTER — Encounter (HOSPITAL_COMMUNITY)
Admission: RE | Disposition: A | Discharge status: Home / Self Care | Payer: Self-pay | Source: Home / Self Care | Attending: General Surgery

## 2022-11-09 ENCOUNTER — Ambulatory Visit (HOSPITAL_BASED_OUTPATIENT_CLINIC_OR_DEPARTMENT_OTHER): Payer: 59 | Admitting: Certified Registered"

## 2022-11-09 ENCOUNTER — Other Ambulatory Visit: Payer: Self-pay

## 2022-11-09 DIAGNOSIS — I350 Nonrheumatic aortic (valve) stenosis: Secondary | ICD-10-CM | POA: Diagnosis not present

## 2022-11-09 DIAGNOSIS — Z95828 Presence of other vascular implants and grafts: Secondary | ICD-10-CM | POA: Insufficient documentation

## 2022-11-09 DIAGNOSIS — I739 Peripheral vascular disease, unspecified: Secondary | ICD-10-CM | POA: Insufficient documentation

## 2022-11-09 DIAGNOSIS — K439 Ventral hernia without obstruction or gangrene: Secondary | ICD-10-CM

## 2022-11-09 DIAGNOSIS — Z8249 Family history of ischemic heart disease and other diseases of the circulatory system: Secondary | ICD-10-CM | POA: Diagnosis not present

## 2022-11-09 DIAGNOSIS — I10 Essential (primary) hypertension: Secondary | ICD-10-CM

## 2022-11-09 DIAGNOSIS — Z87891 Personal history of nicotine dependence: Secondary | ICD-10-CM

## 2022-11-09 HISTORY — PX: INSERTION OF MESH: SHX5868

## 2022-11-09 HISTORY — PX: VENTRAL HERNIA REPAIR: SHX424

## 2022-11-09 LAB — GLUCOSE, CAPILLARY: Glucose-Capillary: 152 mg/dL — ABNORMAL HIGH (ref 70–99)

## 2022-11-09 SURGERY — REPAIR, HERNIA, VENTRAL, LAPAROSCOPIC
Anesthesia: General | Site: Abdomen

## 2022-11-09 MED ORDER — PHENYLEPHRINE 80 MCG/ML (10ML) SYRINGE FOR IV PUSH (FOR BLOOD PRESSURE SUPPORT)
PREFILLED_SYRINGE | INTRAVENOUS | Status: DC | PRN
  Administered 2022-11-09: 80 ug via INTRAVENOUS
  Administered 2022-11-09 (×2): 40 ug via INTRAVENOUS

## 2022-11-09 MED ORDER — NITROGLYCERIN 0.4 MG SL SUBL: 0.4000 mg | SUBLINGUAL_TABLET | SUBLINGUAL | Status: DC | PRN

## 2022-11-09 MED ORDER — HYDROMORPHONE HCL 1 MG/ML IJ SOLN
INTRAMUSCULAR | Status: AC
  Filled 2022-11-09: qty 1

## 2022-11-09 MED ORDER — MORPHINE SULFATE (PF) 2 MG/ML IV SOLN
1.0000 mg | INTRAVENOUS | Status: DC | PRN
  Administered 2022-11-09: 4 mg via INTRAVENOUS
  Filled 2022-11-09: qty 2

## 2022-11-09 MED ORDER — ONDANSETRON HCL 4 MG/2ML IJ SOLN
INTRAMUSCULAR | Status: DC | PRN
Start: 2022-11-09 — End: 2022-11-09
  Administered 2022-11-09: 4 mg via INTRAVENOUS

## 2022-11-09 MED ORDER — CHLORHEXIDINE GLUCONATE CLOTH 2 % EX PADS: 6.0000 | MEDICATED_PAD | Freq: Once | CUTANEOUS | Status: DC

## 2022-11-09 MED ORDER — CEFAZOLIN SODIUM-DEXTROSE 2-4 GM/100ML-% IV SOLN
INTRAVENOUS | Status: AC
  Filled 2022-11-09: qty 100

## 2022-11-09 MED ORDER — OXYCODONE HCL 5 MG PO TABS: 5.0000 mg | ORAL_TABLET | Freq: Four times a day (QID) | ORAL | 0 refills | Status: DC | PRN

## 2022-11-09 MED ORDER — GABAPENTIN 100 MG PO CAPS: 100.0000 mg | ORAL_CAPSULE | ORAL | Status: AC

## 2022-11-09 MED ORDER — ONDANSETRON HCL 4 MG/2ML IJ SOLN: 4.0000 mg | Freq: Once | INTRAMUSCULAR | Status: DC | PRN

## 2022-11-09 MED ORDER — OXYCODONE HCL 5 MG PO TABS
5.0000 mg | ORAL_TABLET | Freq: Once | ORAL | Status: AC | PRN
  Administered 2022-11-09: 5 mg via ORAL

## 2022-11-09 MED ORDER — METHOCARBAMOL 500 MG PO TABS
ORAL_TABLET | ORAL | Status: AC
  Filled 2022-11-09: qty 1

## 2022-11-09 MED ORDER — 0.9 % SODIUM CHLORIDE (POUR BTL) OPTIME
TOPICAL | Status: DC | PRN
  Administered 2022-11-09: 1000 mL

## 2022-11-09 MED ORDER — HYDROMORPHONE HCL 1 MG/ML IJ SOLN
INTRAMUSCULAR | Status: AC
  Filled 2022-11-09: qty 0.5

## 2022-11-09 MED ORDER — CHLORHEXIDINE GLUCONATE 0.12 % MT SOLN: 15.0000 mL | Freq: Once | OROMUCOSAL | Status: AC

## 2022-11-09 MED ORDER — ONDANSETRON HCL 4 MG/2ML IJ SOLN
INTRAMUSCULAR | Status: AC
  Filled 2022-11-09: qty 2

## 2022-11-09 MED ORDER — BUPIVACAINE-EPINEPHRINE 0.25% -1:200000 IJ SOLN
INTRAMUSCULAR | Status: DC | PRN
  Administered 2022-11-09: 14 mL

## 2022-11-09 MED ORDER — PROPOFOL 10 MG/ML IV BOLUS
INTRAVENOUS | Status: DC | PRN
  Administered 2022-11-09: 150 mg via INTRAVENOUS
  Administered 2022-11-09: 50 mg via INTRAVENOUS

## 2022-11-09 MED ORDER — OXYCODONE HCL 5 MG PO TABS
5.0000 mg | ORAL_TABLET | ORAL | Status: DC | PRN
  Administered 2022-11-09 – 2022-11-10 (×2): 5 mg via ORAL
  Administered 2022-11-10: 10 mg via ORAL
  Filled 2022-11-09: qty 1
  Filled 2022-11-09 (×2): qty 2

## 2022-11-09 MED ORDER — BUPIVACAINE-EPINEPHRINE (PF) 0.25% -1:200000 IJ SOLN
INTRAMUSCULAR | Status: AC
  Filled 2022-11-09: qty 30

## 2022-11-09 MED ORDER — OXYCODONE HCL 5 MG PO TABS
ORAL_TABLET | ORAL | Status: AC
  Filled 2022-11-09: qty 1

## 2022-11-09 MED ORDER — ONDANSETRON 4 MG PO TBDP: 4.0000 mg | ORAL_TABLET | Freq: Four times a day (QID) | ORAL | Status: DC | PRN

## 2022-11-09 MED ORDER — METHOCARBAMOL 500 MG PO TABS
500.0000 mg | ORAL_TABLET | Freq: Four times a day (QID) | ORAL | Status: DC | PRN
  Administered 2022-11-09: 500 mg via ORAL
  Filled 2022-11-09 (×2): qty 1

## 2022-11-09 MED ORDER — DEXAMETHASONE SODIUM PHOSPHATE 10 MG/ML IJ SOLN
INTRAMUSCULAR | Status: AC
  Filled 2022-11-09: qty 1

## 2022-11-09 MED ORDER — SUCCINYLCHOLINE CHLORIDE 200 MG/10ML IV SOSY
PREFILLED_SYRINGE | INTRAVENOUS | Status: AC
  Filled 2022-11-09: qty 10

## 2022-11-09 MED ORDER — PROPOFOL 10 MG/ML IV BOLUS
INTRAVENOUS | Status: AC
  Filled 2022-11-09: qty 20

## 2022-11-09 MED ORDER — IRBESARTAN 75 MG PO TABS
37.5000 mg | ORAL_TABLET | Freq: Every day | ORAL | Status: DC
  Administered 2022-11-09: 37.5 mg via ORAL
  Filled 2022-11-09: qty 1

## 2022-11-09 MED ORDER — CEFAZOLIN SODIUM-DEXTROSE 2-4 GM/100ML-% IV SOLN
2.0000 g | INTRAVENOUS | Status: AC
  Administered 2022-11-09: 2 g via INTRAVENOUS

## 2022-11-09 MED ORDER — DEXAMETHASONE SODIUM PHOSPHATE 10 MG/ML IJ SOLN
INTRAMUSCULAR | Status: DC | PRN
  Administered 2022-11-09: 5 mg via INTRAVENOUS

## 2022-11-09 MED ORDER — MIDAZOLAM HCL 2 MG/2ML IJ SOLN
INTRAMUSCULAR | Status: AC
  Filled 2022-11-09: qty 2

## 2022-11-09 MED ORDER — PANTOPRAZOLE SODIUM 40 MG IV SOLR
40.0000 mg | Freq: Every day | INTRAVENOUS | Status: DC
  Administered 2022-11-09: 40 mg via INTRAVENOUS
  Filled 2022-11-09: qty 10

## 2022-11-09 MED ORDER — LACTATED RINGERS IV SOLN: INTRAVENOUS | Status: DC

## 2022-11-09 MED ORDER — KETOROLAC TROMETHAMINE 30 MG/ML IJ SOLN
INTRAMUSCULAR | Status: DC | PRN
  Administered 2022-11-09: 15 mg via INTRAVENOUS

## 2022-11-09 MED ORDER — ATORVASTATIN CALCIUM 80 MG PO TABS
80.0000 mg | ORAL_TABLET | Freq: Every day | ORAL | Status: DC
  Administered 2022-11-09: 80 mg via ORAL
  Filled 2022-11-09: qty 1

## 2022-11-09 MED ORDER — ACETAMINOPHEN 500 MG PO TABS: 1000.0000 mg | ORAL_TABLET | ORAL | Status: AC

## 2022-11-09 MED ORDER — ONDANSETRON HCL 4 MG/2ML IJ SOLN: 4.0000 mg | Freq: Four times a day (QID) | INTRAMUSCULAR | Status: DC | PRN

## 2022-11-09 MED ORDER — MIDAZOLAM HCL 2 MG/2ML IJ SOLN
INTRAMUSCULAR | Status: DC | PRN
  Administered 2022-11-09: 2 mg via INTRAVENOUS

## 2022-11-09 MED ORDER — ROCURONIUM BROMIDE 10 MG/ML (PF) SYRINGE
PREFILLED_SYRINGE | INTRAVENOUS | Status: AC
  Filled 2022-11-09: qty 10

## 2022-11-09 MED ORDER — HYDROMORPHONE HCL 1 MG/ML IJ SOLN
0.2500 mg | INTRAMUSCULAR | Status: DC | PRN
  Administered 2022-11-09: 0.5 mg via INTRAVENOUS
  Administered 2022-11-09 (×2): 0.25 mg via INTRAVENOUS

## 2022-11-09 MED ORDER — ENOXAPARIN SODIUM 30 MG/0.3ML IJ SOSY
30.0000 mg | PREFILLED_SYRINGE | INTRAMUSCULAR | Status: DC
  Administered 2022-11-10: 30 mg via SUBCUTANEOUS
  Filled 2022-11-09: qty 0.3

## 2022-11-09 MED ORDER — LIDOCAINE 2% (20 MG/ML) 5 ML SYRINGE
INTRAMUSCULAR | Status: AC
  Filled 2022-11-09: qty 5

## 2022-11-09 MED ORDER — ORAL CARE MOUTH RINSE: 15.0000 mL | Freq: Once | OROMUCOSAL | Status: AC

## 2022-11-09 MED ORDER — SUGAMMADEX SODIUM 200 MG/2ML IV SOLN
INTRAVENOUS | Status: DC | PRN
  Administered 2022-11-09: 200 mg via INTRAVENOUS

## 2022-11-09 MED ORDER — METOPROLOL TARTRATE 25 MG PO TABS
25.0000 mg | ORAL_TABLET | Freq: Two times a day (BID) | ORAL | Status: DC
  Filled 2022-11-09: qty 1

## 2022-11-09 MED ORDER — OXYCODONE HCL 5 MG/5ML PO SOLN: 5.0000 mg | Freq: Once | ORAL | Status: AC | PRN

## 2022-11-09 MED ORDER — SODIUM CHLORIDE 0.9 % IV SOLN: INTRAVENOUS | Status: DC

## 2022-11-09 MED ORDER — DEXMEDETOMIDINE HCL IN NACL 80 MCG/20ML IV SOLN
INTRAVENOUS | Status: AC
  Filled 2022-11-09: qty 20

## 2022-11-09 MED ORDER — GABAPENTIN 100 MG PO CAPS
ORAL_CAPSULE | ORAL | Status: AC
  Administered 2022-11-09: 100 mg via ORAL
  Filled 2022-11-09: qty 1

## 2022-11-09 MED ORDER — CHLORHEXIDINE GLUCONATE 0.12 % MT SOLN
OROMUCOSAL | Status: AC
  Administered 2022-11-09: 15 mL via OROMUCOSAL
  Filled 2022-11-09: qty 15

## 2022-11-09 MED ORDER — FENTANYL CITRATE (PF) 250 MCG/5ML IJ SOLN
INTRAMUSCULAR | Status: AC
  Filled 2022-11-09: qty 5

## 2022-11-09 MED ORDER — LIDOCAINE 2% (20 MG/ML) 5 ML SYRINGE
INTRAMUSCULAR | Status: DC | PRN
  Administered 2022-11-09: 100 mg via INTRAVENOUS

## 2022-11-09 MED ORDER — KETAMINE HCL 50 MG/5ML IJ SOSY
PREFILLED_SYRINGE | INTRAMUSCULAR | Status: AC
  Filled 2022-11-09: qty 5

## 2022-11-09 MED ORDER — ACETAMINOPHEN 500 MG PO TABS
ORAL_TABLET | ORAL | Status: AC
  Administered 2022-11-09: 1000 mg via ORAL
  Filled 2022-11-09: qty 2

## 2022-11-09 MED ORDER — ROCURONIUM BROMIDE 10 MG/ML (PF) SYRINGE
PREFILLED_SYRINGE | INTRAVENOUS | Status: DC | PRN
  Administered 2022-11-09: 60 mg via INTRAVENOUS
  Administered 2022-11-09: 10 mg via INTRAVENOUS

## 2022-11-09 MED ORDER — HYDROMORPHONE HCL 1 MG/ML IJ SOLN
INTRAMUSCULAR | Status: AC
  Administered 2022-11-09: 0.5 mg via INTRAVENOUS
  Filled 2022-11-09: qty 1

## 2022-11-09 MED ORDER — FENTANYL CITRATE (PF) 250 MCG/5ML IJ SOLN
INTRAMUSCULAR | Status: DC | PRN
  Administered 2022-11-09: 100 ug via INTRAVENOUS
  Administered 2022-11-09 (×2): 50 ug via INTRAVENOUS

## 2022-11-09 SURGICAL SUPPLY — 51 items
ADH SKN CLS APL DERMABOND .7 (GAUZE/BANDAGES/DRESSINGS) ×1
APL PRP STRL LF DISP 70% ISPRP (MISCELLANEOUS) ×1
BAG COUNTER SPONGE SURGICOUNT (BAG) ×1 IMPLANT
BAG SPNG CNTER NS LX DISP (BAG)
BINDER ABDOMINAL 12 ML 46-62 (SOFTGOODS) IMPLANT
BNDG GAUZE DERMACEA FLUFF 4 (GAUZE/BANDAGES/DRESSINGS) IMPLANT
BNDG GZE DERMACEA 4 6PLY (GAUZE/BANDAGES/DRESSINGS)
CANISTER SUCT 3000ML PPV (MISCELLANEOUS) IMPLANT
CHLORAPREP W/TINT 26 (MISCELLANEOUS) ×1 IMPLANT
COVER SURGICAL LIGHT HANDLE (MISCELLANEOUS) ×1 IMPLANT
DERMABOND ADVANCED .7 DNX12 (GAUZE/BANDAGES/DRESSINGS) ×1 IMPLANT
DEVICE SECURE STRAP 25 ABSORB (INSTRUMENTS) ×1 IMPLANT
DEVICE TROCAR PUNCTURE CLOSURE (ENDOMECHANICALS) ×1 IMPLANT
DRAPE INCISE IOBAN 66X45 STRL (DRAPES) ×1 IMPLANT
DRAPE LAPAROSCOPIC ABDOMINAL (DRAPES) ×1 IMPLANT
ELECT CAUTERY BLADE 6.4 (BLADE) ×1 IMPLANT
ELECT REM PT RETURN 9FT ADLT (ELECTROSURGICAL) ×1
ELECTRODE REM PT RTRN 9FT ADLT (ELECTROSURGICAL) ×1 IMPLANT
GLOVE BIO SURGEON STRL SZ7.5 (GLOVE) ×1 IMPLANT
GOWN STRL REUS W/ TWL LRG LVL3 (GOWN DISPOSABLE) ×3 IMPLANT
GOWN STRL REUS W/TWL LRG LVL3 (GOWN DISPOSABLE) ×3
IRRIG SUCT STRYKERFLOW 2 WTIP (MISCELLANEOUS)
IRRIGATION SUCT STRKRFLW 2 WTP (MISCELLANEOUS) IMPLANT
KIT BASIN OR (CUSTOM PROCEDURE TRAY) ×1 IMPLANT
KIT TURNOVER KIT B (KITS) ×1 IMPLANT
MARKER SKIN DUAL TIP RULER LAB (MISCELLANEOUS) ×1 IMPLANT
MESH OVITEX LPR PERM 15X20 4L (Graft) IMPLANT
NDL SPNL 22GX3.5 QUINCKE BK (NEEDLE) ×1 IMPLANT
NEEDLE SPNL 22GX3.5 QUINCKE BK (NEEDLE) ×1
NS IRRIG 1000ML POUR BTL (IV SOLUTION) ×1 IMPLANT
PAD ARMBOARD 7.5X6 YLW CONV (MISCELLANEOUS) ×2 IMPLANT
PENCIL BUTTON HOLSTER BLD 10FT (ELECTRODE) ×1 IMPLANT
SCISSORS LAP 5X35 DISP (ENDOMECHANICALS) IMPLANT
SET TUBE SMOKE EVAC HIGH FLOW (TUBING) ×1 IMPLANT
SHEARS HARMONIC ACE PLUS 36CM (ENDOMECHANICALS) IMPLANT
SLEEVE Z-THREAD 5X100MM (TROCAR) ×1 IMPLANT
SUT MNCRL AB 4-0 PS2 18 (SUTURE) ×1 IMPLANT
SUT NOVA NAB DX-16 0-1 5-0 T12 (SUTURE) ×1 IMPLANT
SUT VIC AB 3-0 SH 27 (SUTURE) ×1
SUT VIC AB 3-0 SH 27XBRD (SUTURE) ×1 IMPLANT
TOWEL GREEN STERILE (TOWEL DISPOSABLE) ×1 IMPLANT
TOWEL GREEN STERILE FF (TOWEL DISPOSABLE) ×1 IMPLANT
TRAY FOLEY W/BAG SLVR 14FR (SET/KITS/TRAYS/PACK) IMPLANT
TRAY FOLEY W/BAG SLVR 16FR (SET/KITS/TRAYS/PACK)
TRAY FOLEY W/BAG SLVR 16FR ST (SET/KITS/TRAYS/PACK) ×1 IMPLANT
TRAY LAPAROSCOPIC MC (CUSTOM PROCEDURE TRAY) ×1 IMPLANT
TROCAR 11X100 Z THREAD (TROCAR) IMPLANT
TROCAR BALLN 12MMX100 BLUNT (TROCAR) IMPLANT
TROCAR Z-THREAD OPTICAL 5X100M (TROCAR) ×1 IMPLANT
WARMER LAPAROSCOPE (MISCELLANEOUS) ×1 IMPLANT
WATER STERILE IRR 1000ML POUR (IV SOLUTION) ×1 IMPLANT

## 2022-11-09 NOTE — Transfer of Care (Signed)
Immediate Anesthesia Transfer of Care Note  Patient: Kelly [name]  Procedure(s) Performed: LAPAROSCOPIC ASSISTED  VENTRAL HERNIA REPAIR WITH MESH (Abdomen) INSERTION OF MESH (Abdomen)  Patient Location: PACU  Anesthesia Type:General  Level of Consciousness: awake  Airway & Oxygen Therapy: Patient Spontanous Breathing and Patient connected to face mask oxygen  Post-op Assessment: Report given to RN, Post -op Vital signs reviewed and stable, and Patient moving all extremities X 4  Post vital signs: Reviewed and stable  Last Vitals:  Vitals Value Taken Time  BP 124/87 11/09/22 1046  Temp    Pulse 69 11/09/22 1049  Resp 18 11/09/22 1048  SpO2 96 % 11/09/22 1049  Vitals shown include unfiled device data.  Last Pain:  Vitals:   11/09/22 0716  TempSrc:   PainSc: 4       Patients Stated Pain Goal: 0 (11/09/22 0716)  Complications: No notable events documented.

## 2022-11-09 NOTE — Interval H&P Note (Signed)
History and Physical Interval Note:  11/09/2022 7:55 AM  Kelly [name]  has presented today for surgery, with the diagnosis of VENTRAL HERNIA.  The various methods of treatment have been discussed with the patient and family. After consideration of risks, benefits and other options for treatment, the patient has consented to  Procedure(s): LAPAROSCOPIC ASSISTED  VENTRAL HERNIA REPAIR WITH MESH (N/A) as a surgical intervention.  The patient's history has been reviewed, patient examined, no change in status, stable for surgery.  I have reviewed the patient's chart and labs.  Questions were answered to the patient's satisfaction.     Chevis Pretty III

## 2022-11-09 NOTE — Anesthesia Procedure Notes (Signed)
Procedure Name: Intubation Date/Time: 11/09/2022 8:37 AM  Performed by: Alwyn Ren, CRNAPre-anesthesia Checklist: Patient identified, Timeout performed, Emergency Drugs available, Patient being monitored and Suction available Patient Re-evaluated:Patient Re-evaluated prior to induction Oxygen Delivery Method: Circle system utilized Preoxygenation: Pre-oxygenation with 100% oxygen Induction Type: IV induction Ventilation: Oral airway inserted - appropriate to patient size Laryngoscope Size: 4 and Mac Grade View: Grade I Tube type: Oral Tube size: 7.5 mm Number of attempts: 1 Airway Equipment and Method: Stylet Placement Confirmation: ETT inserted through vocal cords under direct vision, positive ETCO2, CO2 detector and breath sounds checked- equal and bilateral Secured at: 22 cm Tube secured with: Tape Dental Injury: Teeth and Oropharynx as per pre-operative assessment

## 2022-11-09 NOTE — H&P (Signed)
REFERRING PHYSICIAN: Velta Addison, MD PROVIDER: Lindell Noe, MD MRN: [id number] DOB: 1955-10-21 Subjective   Chief Complaint: New Consultation and Hernia  History of Present Illness: Kelly [name] is a 67 y.o. female who is seen today as an office consultation for evaluation of New Consultation and Hernia  We are asked to see the patient in consultation by Dr. Brayton El to evaluate her for a ventral hernia. The patient is a 67 year old black female who has been experiencing abdominal swelling with pain over the last couple years. She denies any significant weight loss during that time. She has had nausea but no vomiting. Her bowels move fairly regularly. She has had some significant abdominal surgery in the past namely a aortobifem bypass. She was supposed to be following up with vascular surgery but has not seen them in a few years.  Review of Systems: A complete review of systems was obtained from the patient. I have reviewed this information and discussed as appropriate with the patient. See HPI as well for other ROS.  ROS   Medical History: Past Medical History:  Diagnosis Date  Heart valve disease  Hypertension   Patient Active Problem List  Diagnosis  Incisional hernia without obstruction or gangrene   Past Surgical History:  Procedure Laterality Date  CESAREAN SECTION  HYSTERECTOMY    No Known Allergies  Current Outpatient Medications on File Prior to Visit  Medication Sig Dispense Refill  atorvastatin (LIPITOR) 40 MG tablet Take 40 mg by mouth once daily  metoprolol tartrate (LOPRESSOR) 25 MG tablet Take 25 mg by mouth 2 (two) times daily  nitroGLYcerin (NITROSTAT) 0.4 MG SL tablet Place 0.4 mg under the tongue every 5 (five) minutes as needed  valsartan (DIOVAN) 40 MG tablet   No current facility-administered medications on file prior to visit.   Family History  Problem Relation Age of Onset  High blood pressure (Hypertension) Mother  Diabetes Mother   Deep vein thrombosis (DVT or abnormal blood clot formation) Mother  High blood pressure (Hypertension) Father  Coronary Artery Disease (Blocked arteries around heart) Father  High blood pressure (Hypertension) Sister  Deep vein thrombosis (DVT or abnormal blood clot formation) Sister  High blood pressure (Hypertension) Brother  Coronary Artery Disease (Blocked arteries around heart) Brother    Social History   Tobacco Use  Smoking Status Former  Current packs/day: 0.00  Types: Cigarettes  Quit date: 2015  Years since quitting: 9.4  Smokeless Tobacco Never    Social History   Socioeconomic History  Marital status: Widowed  Tobacco Use  Smoking status: Former  Current packs/day: 0.00  Types: Cigarettes  Quit date: 2015  Years since quitting: 9.4  Smokeless tobacco: Never  Substance and Sexual Activity  Alcohol use: Yes  Drug use: Never   Objective:   Vitals:  BP: (!) 148/94  Pulse: 75  Temp: 36.1 C (97 F)  SpO2: 97%  Weight: 88.9 kg (196 lb)  Height: 167.6 cm (5\' 6" )   Body mass index is 31.64 kg/m.  Physical Exam Vitals reviewed.  Constitutional:  General: She is not in acute distress. Appearance: Normal appearance.  HENT:  Head: Normocephalic and atraumatic.  Right Ear: External ear normal.  Left Ear: External ear normal.  Nose: Nose normal.  Mouth/Throat:  Mouth: Mucous membranes are moist.  Pharynx: Oropharynx is clear.  Eyes:  General: No scleral icterus. Extraocular Movements: Extraocular movements intact.  Conjunctiva/sclera: Conjunctivae normal.  Pupils: Pupils are equal, round, and reactive to light.  Cardiovascular:  Rate and Rhythm: Normal rate and regular rhythm.  Pulses: Normal pulses.  Heart sounds: Normal heart sounds.  Pulmonary:  Effort: Pulmonary effort is normal. No respiratory distress.  Breath sounds: Normal breath sounds.  Abdominal:  General: Bowel sounds are normal.  Palpations: Abdomen is soft.  Tenderness: There  is no abdominal tenderness.  Comments: The patient appears to have a rectus diastases along the upper abdomen and within this there may be a soft reducible ventral hernia just above the umbilicus. She has a large midline incision that is well-healed. There is no sign of obstruction. She does have some mild tenderness to palpation just above the umbilicus.  Musculoskeletal:  General: No swelling, tenderness or deformity. Normal range of motion.  Cervical back: Normal range of motion and neck supple.  Skin: General: Skin is warm and dry.  Coloration: Skin is not jaundiced.  Neurological:  General: No focal deficit present.  Mental Status: She is alert and oriented to person, place, and time.  Psychiatric:  Mood and Affect: Mood normal.  Behavior: Behavior normal.     Labs, Imaging and Diagnostic Testing:  Assessment and Plan:   Diagnoses and all orders for this visit:  Incisional hernia without obstruction or gangrene - Ambulatory Referral to Cardiology - Ambulatory Referral to Vascular Surgery - [facility] Case Posting Request; Future   The patient appears to have a ventral hernia measuring about 4 to 5 cm or so. There is no sign of obstruction. Because the hernia is causing her some discomfort I do feel she may benefit from having this fixed. She would also like to have this done. I have discussed with her in detail the risks and benefits of the surgery to fix the hernia as well as some of the technical aspects including the use of mesh and the risk of chronic pain and she understands and wishes to proceed. She might be a candidate for a laparoscopic assisted repair if she does not have too much scar tissue in the abdomen. Prior to scheduling she will need clearance from cardiology and she will need to follow-up with the vascular surgeons because of her previous bypass. Once we have clearance then we can proceed with surgical scheduling.

## 2022-11-09 NOTE — Anesthesia Postprocedure Evaluation (Signed)
Anesthesia Post Note  Patient: Sevanah Bardon  Procedure(s) Performed: LAPAROSCOPIC ASSISTED  VENTRAL HERNIA REPAIR WITH MESH (Abdomen) INSERTION OF MESH (Abdomen)     Patient location during evaluation: PACU Anesthesia Type: General Level of consciousness: awake and alert Pain management: pain level controlled Vital Signs Assessment: post-procedure vital signs reviewed and stable Respiratory status: spontaneous breathing, nonlabored ventilation, respiratory function stable and patient connected to nasal cannula oxygen Cardiovascular status: blood pressure returned to baseline and stable Postop Assessment: no apparent nausea or vomiting Anesthetic complications: no  No notable events documented.  Last Vitals:  Vitals:   11/09/22 1230 11/09/22 1248  BP: (!) 141/93 (!) 155/97  Pulse: 60 71  Resp: 14 20  Temp:    SpO2: 93% 93%    Last Pain:  Vitals:   11/09/22 1300  TempSrc:   PainSc: 10-Worst pain ever                 Arieal Cuoco S

## 2022-11-09 NOTE — Op Note (Addendum)
11/09/2022  10:29 AM  PATIENT:  Kelly [name]  67 y.o. female  PRE-OPERATIVE DIAGNOSIS:  VENTRAL HERNIA 5cm  POST-OPERATIVE DIAGNOSIS:  VENTRAL HERNIA  PROCEDURE:  Procedure(s): LAPAROSCOPIC ASSISTED  VENTRAL HERNIA REPAIR WITH MESH (N/A) INSERTION OF MESH (N/A)  SURGEON:  Surgeons and Role:    * Griselda Miner, MD - Primary  PHYSICIAN ASSISTANT:   ASSISTANTS: Jeronimo Greaves, RNFA   ANESTHESIA:   local and general  EBL:  15 mL   BLOOD ADMINISTERED:none  DRAINS: none   LOCAL MEDICATIONS USED:  MARCAINE     SPECIMEN:  No Specimen  DISPOSITION OF SPECIMEN:  N/A  COUNTS:  YES  TOURNIQUET:  * No tourniquets in log *  DICTATION: .Dragon Dictation  After informed consent was obtained the patient was brought to the operating room and placed in the supine position on the operating table.  After adequate induction of general anesthesia the patient's abdomen was prepped with ChloraPrep, allowed to dry, and draped in usual sterile manner including the use of an Ioban drape.  An appropriate timeout was performed.  A site was chosen in the left upper quadrant to access the abdominal cavity.  This area was infiltrated with quarter percent Marcaine.  A small stab incision was made with a 15 blade knife.  A 5 mm Optiview port and camera were used to dissect bluntly through the layers of the abdominal wall under direct vision until access was gained to the abdominal cavity.  The abdomen was then insufflated with carbon dioxide without difficulty.  The camera was reinserted through the port and the abdomen was inspected.  There were adhesions to the midline that appeared to be mostly omentum.  A second port was placed under direct vision on the left lower quadrant.  A third 5 mm port was placed in the right mid abdomen also under direct vision.  Next a harmonic scalpel was used to take down the omental adhesions to the abdominal wall.  I was able to identify the larger of the fascial defects.   There appeared to be the end the edge of the bowel wall that was out in the hernia but this reduced easily.  I was then able to clear the rest of the abdominal wall without difficulty and without worry of injury to the bowel.  The upper midline abdominal wall had a Swiss cheeselike appearance.  This collection of defects was then measured using a spinal needle and ruler.  I chose a 15 x 20 cm piece of Ovitex LPR mesh.  The mesh was soaked in saline for 5 minutes.  The mesh was oriented with the Prolene side towards the abdominal wall and also oriented with letters corresponding to letters on the abdominal wall as well.  4 stitches of #1 Novafil were placed at equidistant points around the edge of the mesh.  Next a small incision was made through the middle of the hernia defect with a 15 blade knife.  The incision was carried through the skin and subcutaneous tissue sharply with the electrocautery until the hernia sac was entered.  Some of the hernia sac was fulgurated.  Next the mesh was placed with the appropriate orientation into the abdominal cavity through this opening.  The fascial defect was then closed with multiple interrupted #1 Novafil stitches.  The abdomen was then insufflated again and the mesh was in good orientation.  4 stab incisions were made at points along the abdominal wall that corresponded to the anchoring stitches.  A suture passer was placed through each of these incisions and used to bring the appropriate tails of anchoring stitch through the abdominal wall.  Once this was accomplished the tails were sits down and tied.  The mesh was appear to be in good apposition to the anterior abdominal wall.  The gaps between the stitches were filled in with 2 rows of secure strap absorbable tacks.  Once this was accomplished the mesh continued to be in good position with good apposition to the abdominal wall.  There were no gaps or redundancy.  The abdomen was examined and found to be hemostatic.  No  other abnormalities were noted on general inspection of the abdominal cavity.  At this point the gas was allowed to escape and the mesh was appeared to remain in good position.  The ports were removed.  The incisions were all closed with interrupted 4-0 Monocryl subcuticular stitches.  The subcutaneous tissue along the main hernia incision was closed with a deep layer of 3-0 Vicryl stitches and then a running 4-0 Monocryl subcuticular stitch for the skin.  Dermabond dressings were then applied.  The patient tolerated the procedure well.  At the end of the case all needle sponge and instrument counts were correct.  The patient was then awakened and taken to recovery in stable condition. The hernia had a swiss cheese appearance but measured approximately 6cm in total.   PLAN OF CARE: Admit for overnight observation  PATIENT DISPOSITION:  PACU - hemodynamically stable.   Delay start of Pharmacological VTE agent (>24hrs) due to surgical blood loss or risk of bleeding: no

## 2022-11-10 ENCOUNTER — Encounter (HOSPITAL_COMMUNITY): Payer: Self-pay | Admitting: General Surgery

## 2022-11-10 DIAGNOSIS — K439 Ventral hernia without obstruction or gangrene: Secondary | ICD-10-CM | POA: Diagnosis not present

## 2022-11-10 LAB — CBC
HCT: 34.3 % — ABNORMAL LOW (ref 36.0–46.0)
Hemoglobin: 11 g/dL — ABNORMAL LOW (ref 12.0–15.0)
MCH: 24.7 pg — ABNORMAL LOW (ref 26.0–34.0)
MCHC: 32.1 g/dL (ref 30.0–36.0)
MCV: 77.1 fL — ABNORMAL LOW (ref 80.0–100.0)
Platelets: 233 10*3/uL (ref 150–400)
RBC: 4.45 MIL/uL (ref 3.87–5.11)
RDW: 15.6 % — ABNORMAL HIGH (ref 11.5–15.5)
WBC: 10.1 10*3/uL (ref 4.0–10.5)
nRBC: 0 % (ref 0.0–0.2)

## 2022-11-10 MED ORDER — POLYETHYLENE GLYCOL 3350 17 G PO PACK
17.0000 g | PACK | Freq: Every day | ORAL | Status: DC | PRN
  Administered 2022-11-10: 17 g via ORAL
  Filled 2022-11-10: qty 1

## 2022-11-10 NOTE — Plan of Care (Signed)
Pt doing well. Pt and daughter given D/C instructions with verbal understanding. Rx was sent to the pharmacy by MD. Pt's incisions are clean and dry with no sign of infection. Pt's IV was removed prior to D/C. Pt D/C'd home via wheelchair per MD order. Pt is stable @ D/C and has no other needs at this time. Rema Fendt, RN

## 2022-11-10 NOTE — Progress Notes (Signed)
1 Day Post-Op   Subjective/Chief Complaint: Complains of pain. Tolerating diet   Objective: Vital signs in last 24 hours: Temp:  [97.4 F (36.3 C)-98.4 F (36.9 C)] 97.6 F (36.4 C) (08/22 0736) Pulse Rate:  [58-73] 70 (08/22 0736) Resp:  [7-20] 18 (08/22 0736) BP: (110-155)/(66-97) 110/74 (08/22 0736) SpO2:  [92 %-95 %] 94 % (08/22 0736)    Intake/Output from previous day: 08/21 0701 - 08/22 0700 In: 1380 [P.O.:480; I.V.:800; IV Piggyback:100] Out: 190 [Urine:175; Blood:15] Intake/Output this shift: No intake/output data recorded.  General appearance: alert and cooperative Resp: clear to auscultation bilaterally Cardio: regular rate and rhythm GI: soft, appropriately tender. Incisions look good  Lab Results:  Recent Labs    11/10/22 0224  WBC 10.1  HGB 11.0*  HCT 34.3*  PLT 233   BMET No results for input(s): "NA", "K", "CL", "CO2", "GLUCOSE", "BUN", "CREATININE", "CALCIUM" in the last 72 hours. PT/INR No results for input(s): "LABPROT", "INR" in the last 72 hours. ABG No results for input(s): "PHART", "HCO3" in the last 72 hours.  Invalid input(s): "PCO2", "PO2"  Studies/Results: No results found.  Anti-infectives: Anti-infectives (From admission, onward)    Start     Dose/Rate Route Frequency Ordered Stop   11/09/22 0715  ceFAZolin (ANCEF) IVPB 2g/100 mL premix        2 g 200 mL/hr over 30 Minutes Intravenous On call to O.R. 11/09/22 0700 11/09/22 0854   11/09/22 0711  ceFAZolin (ANCEF) 2-4 GM/100ML-% IVPB       Note to Pharmacy: Patsi Sears E: cabinet override      11/09/22 0711 11/09/22 0845       Assessment/Plan: s/p Procedure(s): LAPAROSCOPIC ASSISTED  VENTRAL HERNIA REPAIR WITH MESH (N/A) INSERTION OF MESH (N/A) Advance diet Discharge  LOS: 0 days    Chevis Pretty III 11/10/2022

## 2022-11-10 NOTE — Discharge Summary (Signed)
Physician Discharge Summary  Patient ID: Kelly [name] MRN: [id number] DOB/AGE: 07/01/1955 67 y.o.  Admit date: 11/09/2022 Discharge date: 11/10/2022  Admission Diagnoses:  Discharge Diagnoses:  Principal Problem:   Ventral hernia without obstruction or gangrene   Discharged Condition: good  Hospital Course: the pt underwent lap assisted ventral hernia repair. On pod 1 she was ready for d/c home  Consults: None  Significant Diagnostic Studies: none  Treatments: surgery: as above  Discharge Exam: Blood pressure 110/74, pulse 70, temperature 97.6 F (36.4 C), temperature source Oral, resp. rate 18, height 5\' 6"  (1.676 m), weight 87.1 kg, SpO2 94%. GI: soft, appropriately tender  Disposition:      Follow-up Information     Chevis Pretty III, MD Follow up in 2 week(s).   Specialty: General Surgery Contact information: [address] [place] [address] [phone]                 Signed: Chevis Pretty III 11/10/2022, 8:25 AM

## 2022-12-30 ENCOUNTER — Ambulatory Visit: Payer: 59 | Attending: Internal Medicine | Admitting: Internal Medicine

## 2022-12-30 ENCOUNTER — Encounter: Payer: Self-pay | Admitting: Internal Medicine

## 2022-12-30 VITALS — BP 126/76 | HR 52 | Ht 68.0 in | Wt 190.0 lb

## 2022-12-30 DIAGNOSIS — I10 Essential (primary) hypertension: Secondary | ICD-10-CM

## 2022-12-30 DIAGNOSIS — I2510 Atherosclerotic heart disease of native coronary artery without angina pectoris: Secondary | ICD-10-CM | POA: Diagnosis not present

## 2022-12-30 DIAGNOSIS — R072 Precordial pain: Secondary | ICD-10-CM

## 2022-12-30 DIAGNOSIS — I739 Peripheral vascular disease, unspecified: Secondary | ICD-10-CM

## 2022-12-30 DIAGNOSIS — E7800 Pure hypercholesterolemia, unspecified: Secondary | ICD-10-CM

## 2022-12-30 DIAGNOSIS — R9431 Abnormal electrocardiogram [ECG] [EKG]: Secondary | ICD-10-CM

## 2022-12-30 NOTE — Patient Instructions (Signed)
Medication Instructions:  Your physician recommends that you continue on your current medications as directed. Please refer to the Current Medication list given to you today.  *If you need a refill on your cardiac medications before your next appointment, please call your pharmacy*   Follow-Up: At [facility], you and your health needs are our priority.  As part of our continuing mission to provide you with exceptional heart care, we have created designated Provider Care Teams.  These Care Teams include your primary Cardiologist (physician) and Advanced Practice Providers (APPs -  Physician Assistants and Nurse Practitioners) who all work together to provide you with the care you need, when you need it.  We recommend signing up for the patient portal called "MyChart".  Sign up information is provided on this After Visit Summary.  MyChart is used to connect with patients for Virtual Visits (Telemedicine).  Patients are able to view lab/test results, encounter notes, upcoming appointments, etc.  Non-urgent messages can be sent to your provider as well.   To learn more about what you can do with MyChart, go to [email or web address].    Your next appointment:   12 month(s)  Provider:   Gayatri A Acharya, MD    

## 2022-12-30 NOTE — Progress Notes (Signed)
Cardiology Office Note:  .   Date:  12/30/2022  ID:  Kelly [name], DOB 1956/01/02, MRN [id number] PCP: Ellyn Hack, MD  [facility] Providers Cardiologist:  Parke Poisson, MD    History of Present Illness: .   Kelly [name] is a 67 y.o. female.  Discussed the use of AI scribe software for clinical note transcription with the patient, who gave verbal consent to proceed.  History of Present Illness   Miss Kelly [name], a patient with a history of hypertension, peripheral artery disease (PAD) with a history of aorta bifemoral bypass, and hyperlipidemia, presents for a follow-up visit after undergoing ventral hernia repair. She has previously been seen for chest discomfort and dyspnea on exertion. Due to these symptoms, she had undergone coronary CTA in July 2024, demonstrating a calcium score of 102, total plaque volume of 243 (moderate range), and moderate coronary artery disease. She also underwent an echocardiogram in July 2024 with EF 60-65%, grade one diastolic dysfunction, normal RV size and function, mildly dilated LA, and grossly normal cardiac valves with mild mitral valve regurgitation. She reports feeling well and has no new complaints. She is on atorvastatin 80mg  for her cholesterol.        ROS: negative except per HPI above.  Studies Reviewed: .        Results   LABS LDL: 75 (10/2022)  RADIOLOGY Coronary CTA: Calcium score 102, total plaque volume 243, moderate coronary artery disease, nonobstructive (09/2022)  DIAGNOSTIC Echocardiogram: EF 60-65%, grade 1 diastolic dysfunction, normal RV size and function, mildly dilated LA, grossly normal cardiac valves, mild mitral valve regurgitation (09/2022)     Risk Assessment/Calculations:             Physical Exam:   VS:  BP 126/76   Pulse (!) 52   Ht 5\' 8"  (1.727 m)   Wt 190 lb (86.2 kg)   SpO2 98%   BMI 28.89 kg/m    Wt Readings from Last 3 Encounters:  12/30/22 190 lb (86.2 kg)  11/09/22 192 lb  (87.1 kg)  10/31/22 192 lb 1.6 oz (87.1 kg)     Physical Exam   CHEST: Lung sounds clear on auscultation. CARDIOVASCULAR: Heart sounds normal, regular rate and rhythm.     GEN: Well nourished, well developed in no acute distress NECK: No JVD; No carotid bruits CARDIAC: RRR, no murmurs, rubs, gallops RESPIRATORY:  Clear to auscultation without rales, wheezing or rhonchi  ABDOMEN: Soft, non-tender, non-distended EXTREMITIES:  No edema; No deformity   ASSESSMENT AND PLAN: .    1. Coronary artery disease involving native coronary artery of native heart without angina pectoris   2. Essential hypertension   3. Precordial pain   4. Hypercholesterolemia   5. Peripheral vascular disease, unspecified (HCC)   6. Abnormal electrocardiogram     Assessment and Plan    Hyperlipidemia LDL 75 on Atorvastatin 80mg  daily. Discussed the importance of maintaining LDL <70 to prevent progression of moderate coronary artery disease identified on coronary CTA in July 2024. -Continue Atorvastatin 80mg  daily. -Check lipid panel in 1 year.  Knee discomfort Recent episode of knee discomfort lasting 2 days, resolved. Discussed possible arthritis and management with ice and Tylenol. -If symptoms recur, consult with primary care provider for further evaluation. Does not seem to be statin myalgia.   General Health Maintenance Patient is recovering well from recent ventral hernia repair. -Return for cardiology follow-up in 1 year. -Contact cardiology if experiencing chest pain or shortness of breath.  Total time of encounter: 25 minutes total time of encounter, including 15 minutes spent in face-to-face patient care on the date of this encounter. This time includes coordination of care and counseling regarding above mentioned problem list. Remainder of non-face-to-face time involved reviewing chart documents/testing relevant to the patient encounter and documentation in the medical  record. I have independently reviewed documentation from referring provider.   Weston Brass, MD, [facility]  [facility]

## 2023-01-12 NOTE — Plan of Care (Signed)
[facility] Tonsillectomy/Adenoidectomy, Postoperative PEDS care plan entered in error.

## 2023-06-19 ENCOUNTER — Ambulatory Visit
Admission: RE | Admit: 2023-06-19 | Discharge: 2023-06-19 | Disposition: A | Discharge status: Home / Self Care | Source: Ambulatory Visit | Attending: Family Medicine | Admitting: Family Medicine

## 2023-06-19 ENCOUNTER — Other Ambulatory Visit: Payer: Self-pay | Admitting: Family Medicine

## 2023-06-19 DIAGNOSIS — K469 Unspecified abdominal hernia without obstruction or gangrene: Secondary | ICD-10-CM

## 2023-10-17 ENCOUNTER — Other Ambulatory Visit: Payer: Self-pay | Admitting: Internal Medicine
